# Patient Record
Sex: Female | Born: 1965 | Race: White | Hispanic: No | Marital: Married | Smoking: Never smoker
Health system: Southern US, Community
[De-identification: ages and names within clinical notes are randomized; demographics above are authoritative.]

## PROBLEM LIST (undated history)

## (undated) DIAGNOSIS — E039 Hypothyroidism, unspecified: Secondary | ICD-10-CM

## (undated) DIAGNOSIS — M810 Age-related osteoporosis without current pathological fracture: Secondary | ICD-10-CM

## (undated) DIAGNOSIS — K649 Unspecified hemorrhoids: Secondary | ICD-10-CM

## (undated) DIAGNOSIS — F845 Asperger's syndrome: Secondary | ICD-10-CM

## (undated) DIAGNOSIS — E042 Nontoxic multinodular goiter: Secondary | ICD-10-CM

## (undated) DIAGNOSIS — F909 Attention-deficit hyperactivity disorder, unspecified type: Secondary | ICD-10-CM

## (undated) DIAGNOSIS — M1990 Unspecified osteoarthritis, unspecified site: Secondary | ICD-10-CM

## (undated) DIAGNOSIS — F419 Anxiety disorder, unspecified: Secondary | ICD-10-CM

## (undated) DIAGNOSIS — K5900 Constipation, unspecified: Secondary | ICD-10-CM

## (undated) DIAGNOSIS — Z8489 Family history of other specified conditions: Secondary | ICD-10-CM

## (undated) DIAGNOSIS — B009 Herpesviral infection, unspecified: Secondary | ICD-10-CM

## (undated) DIAGNOSIS — F840 Autistic disorder: Secondary | ICD-10-CM

## (undated) DIAGNOSIS — T753XXA Motion sickness, initial encounter: Secondary | ICD-10-CM

## (undated) DIAGNOSIS — G43909 Migraine, unspecified, not intractable, without status migrainosus: Secondary | ICD-10-CM

## (undated) DIAGNOSIS — I7300 Raynaud's syndrome without gangrene: Secondary | ICD-10-CM

## (undated) HISTORY — PX: SHOULDER SURGERY: SHX246

## (undated) HISTORY — PX: TONSILLECTOMY: SUR1361

## (undated) HISTORY — PX: ARTHOSCOPIC ROTAOR CUFF REPAIR: SHX5002

## (undated) HISTORY — PX: CHOLECYSTECTOMY: SHX55

---

## 2005-06-06 ENCOUNTER — Ambulatory Visit: Payer: Self-pay | Admitting: Internal Medicine

## 2005-06-19 ENCOUNTER — Ambulatory Visit: Payer: Self-pay | Admitting: Internal Medicine

## 2005-06-25 ENCOUNTER — Ambulatory Visit: Payer: Self-pay | Admitting: Internal Medicine

## 2005-08-01 ENCOUNTER — Ambulatory Visit: Payer: Self-pay | Admitting: Internal Medicine

## 2005-09-18 ENCOUNTER — Ambulatory Visit: Payer: Self-pay | Admitting: General Surgery

## 2005-12-26 ENCOUNTER — Ambulatory Visit: Payer: Self-pay | Admitting: Internal Medicine

## 2006-07-08 ENCOUNTER — Ambulatory Visit: Payer: Self-pay | Admitting: Internal Medicine

## 2007-05-27 ENCOUNTER — Ambulatory Visit: Payer: Self-pay | Admitting: Internal Medicine

## 2007-07-02 ENCOUNTER — Ambulatory Visit: Payer: Self-pay | Admitting: Gastroenterology

## 2007-07-06 ENCOUNTER — Ambulatory Visit: Payer: Self-pay | Admitting: Internal Medicine

## 2007-07-09 ENCOUNTER — Ambulatory Visit: Payer: Self-pay | Admitting: General Surgery

## 2007-07-15 ENCOUNTER — Ambulatory Visit: Payer: Self-pay | Admitting: General Surgery

## 2007-10-12 ENCOUNTER — Ambulatory Visit: Payer: Self-pay | Admitting: Internal Medicine

## 2009-03-30 IMAGING — NM NUCLEAR MEDICINE HEPATOHBILIARY INCLUDE GB
3 series · 21 of 21 positions shown · non-contrast
Comparison: none

REASON FOR EXAM: Abdominal pain, normal U/S May 2007
COMMENTS:

[Series 1000: gallbladder statics · 4.80mm/px · 9 of 9 slices shown]
[im 1/9]
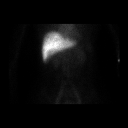
[im 2/9]
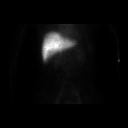
[im 3/9]
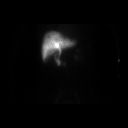
[im 4/9]
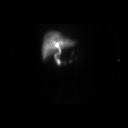
[im 5/9]
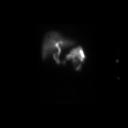
[im 6/9]
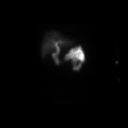
[im 7/9]
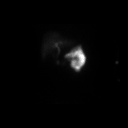
[im 8/9]
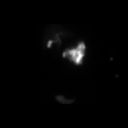
[im 9/9]
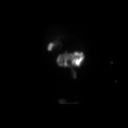

[Series 1000: gallbladder dynamic (results) · 4.80mm/px · 6 of 60 frames shown]
[frame 6/60]
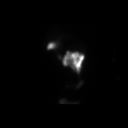
[frame 16/60]
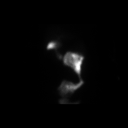
[frame 26/60]
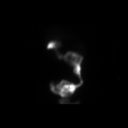
[frame 36/60]
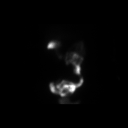
[frame 46/60]
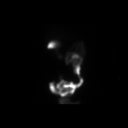
[frame 56/60]
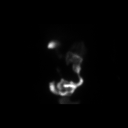

[Series 1000: gallbladder dynamic · 4.80mm/px · 6 of 60 frames shown]
[frame 6/60]
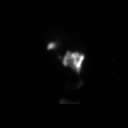
[frame 16/60]
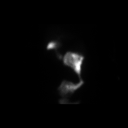
[frame 26/60]
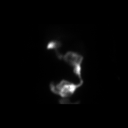
[frame 36/60]
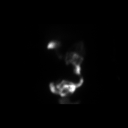
[frame 46/60]
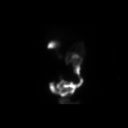
[frame 56/60]
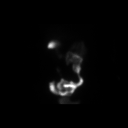

[21 of 21 positions shown; findings below may reference images not displayed]

PROCEDURE:     NM  - NM HEPATO WITH GB EJECT FRACTION  - July 06, 2007 [DATE]

RESULT:     The patient received 7.3 mCi of Technetium 99m labeled Choletec.
Standard imaging was obtained of the abdomen.

Diffuse homogeneous radiotracer activity is demonstrated within the liver.
Subsequent excretion of contrast is identified within the intrahepatic and
extrahepatic ductal system. Small bowel activity is identified at 10 minutes
and gallbladder activity is identified at 60 minutes with increased
activity. There is peristalsis and increased activity within the small
bowel.

Gallbladder ejection fraction was calculated status post intravenous
administration of 1.22 micrograms of Sincalide over 30 minutes IV injection.
The gallbladder ejection fraction was measured at 3%.
IMPRESSION: 1.     Unremarkable hepatobiliary portion of the study.
2.     Abnormal gallbladder ejection fraction consistent with gallbladder
dyskinesia. This raises the concern of chronic cholecystitis.

## 2009-10-17 ENCOUNTER — Ambulatory Visit: Payer: Self-pay | Admitting: Internal Medicine

## 2010-07-26 ENCOUNTER — Ambulatory Visit: Payer: Self-pay

## 2010-08-28 ENCOUNTER — Ambulatory Visit: Payer: Self-pay | Admitting: Unknown Physician Specialty

## 2010-12-25 ENCOUNTER — Ambulatory Visit: Payer: Self-pay | Admitting: Internal Medicine

## 2010-12-27 ENCOUNTER — Ambulatory Visit: Payer: Self-pay | Admitting: Internal Medicine

## 2012-01-02 ENCOUNTER — Ambulatory Visit: Payer: Self-pay | Admitting: Internal Medicine

## 2013-02-23 ENCOUNTER — Ambulatory Visit: Payer: Self-pay | Admitting: Internal Medicine

## 2015-03-28 ENCOUNTER — Other Ambulatory Visit: Payer: Self-pay | Admitting: Obstetrics and Gynecology

## 2015-03-28 DIAGNOSIS — Z1231 Encounter for screening mammogram for malignant neoplasm of breast: Secondary | ICD-10-CM

## 2015-04-26 ENCOUNTER — Ambulatory Visit
Admission: RE | Admit: 2015-04-26 | Discharge: 2015-04-26 | Disposition: A | Discharge status: Home / Self Care | Payer: 59 | Source: Ambulatory Visit | Attending: Obstetrics and Gynecology | Admitting: Obstetrics and Gynecology

## 2015-04-26 DIAGNOSIS — Z1231 Encounter for screening mammogram for malignant neoplasm of breast: Secondary | ICD-10-CM | POA: Diagnosis present

## 2015-05-15 ENCOUNTER — Ambulatory Visit: Admission: RE | Admit: 2015-05-15 | Payer: 59 | Source: Ambulatory Visit | Admitting: Gastroenterology

## 2015-05-15 ENCOUNTER — Encounter: Admission: RE | Payer: Self-pay | Source: Ambulatory Visit

## 2015-05-15 SURGERY — COLONOSCOPY WITH PROPOFOL
Anesthesia: General

## 2015-06-29 ENCOUNTER — Encounter: Payer: Self-pay | Admitting: *Deleted

## 2015-06-30 ENCOUNTER — Ambulatory Visit
Admission: RE | Admit: 2015-06-30 | Discharge: 2015-06-30 | Disposition: A | Discharge status: Home / Self Care | Payer: 59 | Source: Ambulatory Visit | Attending: Gastroenterology | Admitting: Gastroenterology

## 2015-06-30 ENCOUNTER — Encounter
Admission: RE | Disposition: A | Discharge status: Home / Self Care | Payer: Self-pay | Source: Ambulatory Visit | Attending: Gastroenterology

## 2015-06-30 ENCOUNTER — Encounter: Payer: Self-pay | Admitting: *Deleted

## 2015-06-30 ENCOUNTER — Ambulatory Visit: Payer: 59 | Admitting: Anesthesiology

## 2015-06-30 DIAGNOSIS — K644 Residual hemorrhoidal skin tags: Secondary | ICD-10-CM | POA: Insufficient documentation

## 2015-06-30 DIAGNOSIS — F909 Attention-deficit hyperactivity disorder, unspecified type: Secondary | ICD-10-CM | POA: Insufficient documentation

## 2015-06-30 DIAGNOSIS — F845 Asperger's syndrome: Secondary | ICD-10-CM | POA: Diagnosis not present

## 2015-06-30 DIAGNOSIS — B009 Herpesviral infection, unspecified: Secondary | ICD-10-CM | POA: Diagnosis not present

## 2015-06-30 DIAGNOSIS — E039 Hypothyroidism, unspecified: Secondary | ICD-10-CM | POA: Insufficient documentation

## 2015-06-30 DIAGNOSIS — K5730 Diverticulosis of large intestine without perforation or abscess without bleeding: Secondary | ICD-10-CM | POA: Diagnosis not present

## 2015-06-30 DIAGNOSIS — Z1211 Encounter for screening for malignant neoplasm of colon: Secondary | ICD-10-CM | POA: Diagnosis not present

## 2015-06-30 DIAGNOSIS — Z79899 Other long term (current) drug therapy: Secondary | ICD-10-CM | POA: Diagnosis not present

## 2015-06-30 DIAGNOSIS — Z7951 Long term (current) use of inhaled steroids: Secondary | ICD-10-CM | POA: Insufficient documentation

## 2015-06-30 DIAGNOSIS — F419 Anxiety disorder, unspecified: Secondary | ICD-10-CM | POA: Diagnosis not present

## 2015-06-30 DIAGNOSIS — Z9889 Other specified postprocedural states: Secondary | ICD-10-CM | POA: Diagnosis not present

## 2015-06-30 DIAGNOSIS — M810 Age-related osteoporosis without current pathological fracture: Secondary | ICD-10-CM | POA: Insufficient documentation

## 2015-06-30 DIAGNOSIS — R1310 Dysphagia, unspecified: Secondary | ICD-10-CM | POA: Insufficient documentation

## 2015-06-30 HISTORY — PX: ESOPHAGOGASTRODUODENOSCOPY (EGD) WITH PROPOFOL: SHX5813

## 2015-06-30 HISTORY — DX: Hypothyroidism, unspecified: E03.9

## 2015-06-30 HISTORY — DX: Anxiety disorder, unspecified: F41.9

## 2015-06-30 HISTORY — DX: Age-related osteoporosis without current pathological fracture: M81.0

## 2015-06-30 HISTORY — DX: Herpesviral infection, unspecified: B00.9

## 2015-06-30 HISTORY — DX: Unspecified hemorrhoids: K64.9

## 2015-06-30 HISTORY — DX: Attention-deficit hyperactivity disorder, unspecified type: F90.9

## 2015-06-30 HISTORY — PX: COLONOSCOPY WITH PROPOFOL: SHX5780

## 2015-06-30 HISTORY — DX: Constipation, unspecified: K59.00

## 2015-06-30 LAB — POCT PREGNANCY, URINE: Preg Test, Ur: NEGATIVE

## 2015-06-30 SURGERY — COLONOSCOPY WITH PROPOFOL
Anesthesia: General

## 2015-06-30 MED ORDER — SODIUM CHLORIDE 0.9 % IV SOLN
INTRAVENOUS | Status: DC
  Administered 2015-06-30: 09:00:00 via INTRAVENOUS

## 2015-06-30 MED ORDER — PROPOFOL 10 MG/ML IV BOLUS
INTRAVENOUS | Status: DC | PRN
  Administered 2015-06-30: 30 mg via INTRAVENOUS
  Administered 2015-06-30: 40 mg via INTRAVENOUS
  Administered 2015-06-30 (×2): 30 mg via INTRAVENOUS
  Administered 2015-06-30: 70 mg via INTRAVENOUS
  Administered 2015-06-30: 30 mg via INTRAVENOUS

## 2015-06-30 MED ORDER — SODIUM CHLORIDE 0.9 % IV SOLN
INTRAVENOUS | Status: DC
  Administered 2015-06-30: 08:00:00 via INTRAVENOUS

## 2015-06-30 MED ORDER — GLYCOPYRROLATE 0.2 MG/ML IJ SOLN
INTRAMUSCULAR | Status: DC | PRN
  Administered 2015-06-30: .2 mg via INTRAVENOUS

## 2015-06-30 MED ORDER — LIDOCAINE HCL (CARDIAC) 20 MG/ML IV SOLN
INTRAVENOUS | Status: DC | PRN
  Administered 2015-06-30: 100 mg via INTRAVENOUS

## 2015-06-30 MED ORDER — PROPOFOL 500 MG/50ML IV EMUL
INTRAVENOUS | Status: DC | PRN
  Administered 2015-06-30: 150 ug/kg/min via INTRAVENOUS

## 2015-06-30 NOTE — Op Note (Signed)
[facility] Gastroenterology Patient Name: April Black Procedure Date: 06/30/2015 8:08 AM MRN: [id number] Account #: [id number] Date of Birth: [birth year] Admit Type: Outpatient Age: 50 Room: [facility] ENDO ROOM 4 Gender: Female Note Status: Finalized Procedure:         Colonoscopy Indications:       Screening for colorectal malignant neoplasm Providers:         Ezzard Standing. Bluford Kaufmann, MD Referring MD:      Marya Amsler. Dareen Piano, MD (Referring MD) Medicines:         Monitored Anesthesia Care Complications:     No immediate complications. Procedure:         Pre-Anesthesia Assessment:                    - Prior to the procedure, a History and Physical was                     performed, and patient medications, allergies and                     sensitivities were reviewed. The patient's tolerance of                     previous anesthesia was reviewed.                    - The risks and benefits of the procedure and the sedation                     options and risks were discussed with the patient. All                     questions were answered and informed consent was obtained.                    - After reviewing the risks and benefits, the patient was                     deemed in satisfactory condition to undergo the procedure.                    After obtaining informed consent, the colonoscope was                     passed under direct vision. Throughout the procedure, the                     patient's blood pressure, pulse, and oxygen saturations                     were monitored continuously. The Colonoscope was                     introduced through the anus and advanced to the the cecum,                     identified by appendiceal orifice and ileocecal valve. The                     colonoscopy was performed without difficulty. The patient                     tolerated the procedure well. The quality of the bowel  preparation was fair. Findings:      The perianal exam findings include non-thrombosed external hemorrhoids.      Multiple small-mouthed diverticula were found in the sigmoid colon.      The exam was otherwise without abnormality. Impression:        - Non-thrombosed external hemorrhoids found on perianal                     exam.                    - Diverticulosis in the sigmoid colon.                    - The examination was otherwise normal.                    - No specimens collected. Recommendation:    - Discharge patient to home.                    - Repeat colonoscopy in 10 years for surveillance.                    - The findings and recommendations were discussed with the                     patient. Procedure Code(s): --- Professional ---                    [phone], Colonoscopy, flexible; diagnostic, including                     collection of specimen(s) by brushing or washing, when                     performed (separate procedure) Diagnosis Code(s): --- Professional ---                    Z12.11, Encounter for screening for malignant neoplasm of                     colon                    K64.4, Residual hemorrhoidal skin tags                    K57.30, Diverticulosis of large intestine without                     perforation or abscess without bleeding CPT copyright 2014 American Medical Association. All rights reserved. The codes documented in this report are preliminary and upon coder review may  be revised to meet current compliance requirements. Wallace Cullens, MD 06/30/2015 9:03:07 AM This report has been signed electronically. Number of Addenda: 0 Note Initiated On: 06/30/2015 8:08 AM Scope Withdrawal Time: 0 hours 7 minutes 52 seconds  Total Procedure Duration: 0 hours 11 minutes 51 seconds       [facility]

## 2015-06-30 NOTE — Transfer of Care (Signed)
Immediate Anesthesia Transfer of Care Note  Patient: April Black  Procedure(s) Performed: Procedure(s): COLONOSCOPY WITH PROPOFOL (N/A) ESOPHAGOGASTRODUODENOSCOPY (EGD) WITH PROPOFOL (N/A)  Patient Location: Endoscopy Unit  Anesthesia Type:General  Level of Consciousness: sedated  Airway & Oxygen Therapy: Patient Spontanous Breathing and Patient connected to nasal cannula oxygen  Post-op Assessment: Report given to RN and Post -op Vital signs reviewed and stable  Post vital signs: Reviewed and stable  Last Vitals:  Filed Vitals:   06/30/15 0745  BP: 128/78  Pulse: 72  Temp: 36.3 C  Resp: 14    Complications: No apparent anesthesia complications

## 2015-06-30 NOTE — Anesthesia Preprocedure Evaluation (Signed)
Anesthesia Evaluation  Patient identified by MRN, date of birth, ID band Patient awake    Reviewed: Allergy & Precautions, H&P , NPO status , Patient's Chart, lab work & pertinent test results  Airway Mallampati: II  TM Distance: >3 FB Neck ROM: full    Dental  (+) Poor Dentition   Pulmonary neg pulmonary ROS, neg shortness of breath,    Pulmonary exam normal breath sounds clear to auscultation       Cardiovascular Exercise Tolerance: Good (-) angina(-) Past MI and (-) DOE negative cardio ROS Normal cardiovascular exam Rhythm:regular Rate:Normal     Neuro/Psych PSYCHIATRIC DISORDERS Anxiety negative neurological ROS     GI/Hepatic negative GI ROS, Neg liver ROS,   Endo/Other  Hypothyroidism   Renal/GU negative Renal ROS  negative genitourinary   Musculoskeletal   Abdominal   Peds  Hematology negative hematology ROS (+)   Anesthesia Other Findings Past Medical History:   ADHD (attention deficit hyperactivity disorder)              Anxiety                                                      Asperger syndrome                                            Constipation                                                 Hemorrhoids                                                  Herpes                                                       Hypothyroidism                                               Osteoporosis                                                Past Surgical History:   TONSILLECTOMY                                                 ARTHOSCOPIC ROTAOR CUFF REPAIR  BMI    Body Mass Index   25.83 kg/m 2      Reproductive/Obstetrics negative OB ROS                             Anesthesia Physical Anesthesia Plan  ASA: III  Anesthesia Plan: General   Post-op Pain Management:    Induction:   Airway Management Planned:   Additional Equipment:    Intra-op Plan:   Post-operative Plan:   Informed Consent: I have reviewed the patients History and Physical, chart, labs and discussed the procedure including the risks, benefits and alternatives for the proposed anesthesia with the patient or authorized representative who has indicated his/her understanding and acceptance.   Dental Advisory Given  Plan Discussed with: Anesthesiologist, CRNA and Surgeon  Anesthesia Plan Comments:         Anesthesia Quick Evaluation

## 2015-06-30 NOTE — Op Note (Signed)
[facility] Gastroenterology Patient Name: April Black Procedure Date: 06/30/2015 8:19 AM MRN: [id number] Account #: [id number] Date of Birth: 1966/03/06 Admit Type: Outpatient Age: 50 Room: [facility] ENDO ROOM 4 Gender: Female Note Status: Finalized Procedure:         Upper GI endoscopy Indications:       Dysphagia Providers:         Ezzard Standing. Bluford Kaufmann, MD Referring MD:      Marya Amsler. Dareen Piano, MD (Referring MD) Medicines:         Monitored Anesthesia Care Complications:     No immediate complications. Procedure:         Pre-Anesthesia Assessment:                    - Prior to the procedure, a History and Physical was                     performed, and patient medications, allergies and                     sensitivities were reviewed. The patient's tolerance of                     previous anesthesia was reviewed.                    - The risks and benefits of the procedure and the sedation                     options and risks were discussed with the patient. All                     questions were answered and informed consent was obtained.                    - After reviewing the risks and benefits, the patient was                     deemed in satisfactory condition to undergo the procedure.                    After obtaining informed consent, the endoscope was passed                     under direct vision. Throughout the procedure, the                     patient's blood pressure, pulse, and oxygen saturations                     were monitored continuously. The Endoscope was introduced                     through the mouth, and advanced to the second part of                     duodenum. The upper GI endoscopy was accomplished without                     difficulty. The patient tolerated the procedure well. Findings:      A single medium-sized nodule with a localized distribution was found in       the lower third of the esophagus. Due to possibility of this  being a  varix, elected not to biopsy. The scope was withdrawn. Dilation was       performed with a Maloney dilator with moderate resistance at 54 Fr.      The entire examined stomach was normal.      The examined duodenum was normal.      The exam of the esophagus was otherwise normal. Impression:        - Nodule found in the esophagus. Dilated.                    - Normal stomach.                    - Normal examined duodenum.                    - No specimens collected. Recommendation:    - Discharge patient to home.                    - Observe patient's clinical course.                    - The findings and recommendations were discussed with the                     patient.                    - Consider EUS to evaluate this lesion in esophagus. Procedure Code(s): --- Professional ---                    [phone], Esophagogastroduodenoscopy, flexible, transoral;                     diagnostic, including collection of specimen(s) by                     brushing or washing, when performed (separate procedure)                    43450, Dilation of esophagus, by unguided sound or bougie,                     single or multiple passes Diagnosis Code(s): --- Professional ---                    K22.8, Other specified diseases of esophagus                    R13.10, Dysphagia, unspecified CPT copyright 2014 American Medical Association. All rights reserved. The codes documented in this report are preliminary and upon coder review may  be revised to meet current compliance requirements. Wallace Cullens, MD 06/30/2015 8:46:29 AM This report has been signed electronically. Number of Addenda: 0 Note Initiated On: 06/30/2015 8:19 AM      [facility]

## 2015-06-30 NOTE — H&P (Signed)
    Primary Care Physician:  Lauro Regulus., MD Primary Gastroenterologist:  Dr. Bluford Kaufmann  Pre-Procedure History & Physical: HPI:  April Black is a 50 y.o. female is here for an EGD/colonoscopy   Past Medical History  Diagnosis Date  . ADHD (attention deficit hyperactivity disorder)   . Anxiety   . Asperger syndrome   . Constipation   . Hemorrhoids   . Herpes   . Hypothyroidism   . Osteoporosis     Past Surgical History  Procedure Laterality Date  . Tonsillectomy    . Arthoscopic rotaor cuff repair      Prior to Admission medications   Medication Sig Start Date End Date Taking? Authorizing Provider  alendronate (FOSAMAX) 70 MG tablet Take 70 mg by mouth once a week. Take with a full glass of water on an empty stomach.   Yes Historical Provider, MD  amphetamine-dextroamphetamine (ADDERALL) 20 MG tablet Take 20 mg by mouth daily.   Yes Historical Provider, MD  Azelastine-Fluticasone 137-50 MCG/ACT SUSP Place into the nose.   Yes Historical Provider, MD  Cholecalciferol 2000 units CAPS Take by mouth.   Yes Historical Provider, MD  ferrous sulfate 325 (65 FE) MG tablet Take 325 mg by mouth daily with breakfast.   Yes Historical Provider, MD  fluconazole (DIFLUCAN) 150 MG tablet Take 150 mg by mouth daily.   Yes Historical Provider, MD  sertraline (ZOLOFT) 50 MG tablet Take 50 mg by mouth daily.   Yes Historical Provider, MD  solifenacin (VESICARE) 5 MG tablet Take 5 mg by mouth daily.   Yes Historical Provider, MD  valACYclovir (VALTREX) 1000 MG tablet Take 1,000 mg by mouth 2 (two) times daily.   Yes Historical Provider, MD    Allergies as of 05/12/2015  . (Not on File)    Family History  Problem Relation Age of Onset  . Breast cancer Neg Hx     Social History   Social History  . Marital Status: Married    Spouse Name: N/A  . Number of Children: N/A  . Years of Education: N/A   Occupational History  . Not on file.   Social History Main Topics  . Smoking  status: Never Smoker   . Smokeless tobacco: Never Used  . Alcohol Use: No  . Drug Use: No  . Sexual Activity: Not on file   Other Topics Concern  . Not on file   Social History Narrative    Review of Systems: See HPI, otherwise negative ROS  Physical Exam: BP 128/78 mmHg  Pulse 72  Temp(Src) 97.3 F (36.3 C) (Tympanic)  Resp 14  Ht  (1.676 m)  Wt 72.576 kg (160 lb)  BMI 25.84 kg/m2  SpO2 100% General:   Alert,  pleasant and cooperative in NAD Head:  Normocephalic and atraumatic. Neck:  Supple; no masses or thyromegaly. Lungs:  Clear throughout to auscultation.    Heart:  Regular rate and rhythm. Abdomen:  Soft, nontender and nondistended. Normal bowel sounds, without guarding, and without rebound.   Neurologic:  Alert and  oriented x4;  grossly normal neurologically.  Impression/Plan: April Black is here for an EGD/colonoscopy to be performed for screening and dysphagia Risks, benefits, limitations, and alternatives regarding  EGD/colonoscopy have been reviewed with the patient.  Questions have been answered.  All parties agreeable.   April Black, Ezzard Standing, MD  06/30/2015, 7:54 AM

## 2015-06-30 NOTE — Anesthesia Postprocedure Evaluation (Signed)
Anesthesia Post Note  Patient: April Black  Procedure(s) Performed: Procedure(s) (LRB): COLONOSCOPY WITH PROPOFOL (N/A) ESOPHAGOGASTRODUODENOSCOPY (EGD) WITH PROPOFOL (N/A)  Patient location during evaluation: Endoscopy Anesthesia Type: General Level of consciousness: awake and alert Pain management: pain level controlled Vital Signs Assessment: post-procedure vital signs reviewed and stable Respiratory status: spontaneous breathing, nonlabored ventilation, respiratory function stable and patient connected to nasal cannula oxygen Cardiovascular status: blood pressure returned to baseline and stable Postop Assessment: no signs of nausea or vomiting Anesthetic complications: no    Last Vitals:  Filed Vitals:   06/30/15 0920 06/30/15 0930  BP: 122/79 119/83  Pulse: 68 73  Temp:    Resp: 14 15    Last Pain: There were no vitals filed for this visit.               Cleda Mccreedy Piscitello

## 2015-07-05 ENCOUNTER — Encounter: Payer: Self-pay | Admitting: Gastroenterology

## 2015-07-18 ENCOUNTER — Telehealth: Payer: Self-pay

## 2015-07-18 NOTE — Telephone Encounter (Signed)
  Oncology Nurse Navigator Documentation  Navigator Location: [facility] (07/18/15 0900) Navigator Encounter Type: Telephone (07/18/15 0900)               Barriers/Navigation Needs: Coordination of Care (07/18/15 0900)   Interventions: Coordination of Care (07/18/15 0900)   Coordination of Care: EUS (07/18/15 0900)                  Time Spent with Patient: 30 (07/18/15 0900)   Received referral for EUS. It has been scheduled for 07/27/15 with Dr Shana Chute at [facility] for evaluation of esophageal lesion. Went over instructions and copy also mailed to home address after verifying. Provided my contact information for any future questions or concerns.  INSTRUCTIONS FOR ENDOSCOPIC ULTRASOUND -Your procedure has been scheduled for February 16th with Dr Shana Chute at [facility] -The hospital will contact you to pre-register over the phone. If for any reason you have not received a call within one week prior to your scheduled procedure date, please call [phone]. -To get your scheduled arrival time, please call the Endoscopy unit at  [phone] between 1-3pm on: February 15th   -ON THE DAY OF YOU PROCEDURE:  1. If you are scheduled for a morning procedure, nothing to drink after midnight  -If you are scheduled for an afternoon procedure, you may have clear liquids until 5 hours prior  to the procedure but no carbonated drinks or broth  2. NO FOOD THE DAY OF YOUR PROCEDURE  3. You may take your heart, seizure, blood pressure, Parkinson's or breathing medications at  6am with just enough water to get your pills down  4. Do not take any oral Diabetic medications the morning of your procedure.  5. If you are a diabetic and are using insulin, please notify your prescribing physician of this  procedure as your dose may need to be altered related to not being able to eat or drink.   5. Do not take Vitamins   -On the day of your procedure, come to the [facility] Admitting/Registration  desk (First desk on the right) at the scheduled arrival time. You MUST have someone drive you home from your procedure. You must have a responsible adult with a valid drivers license who is on site throughout your entire procedure and who can stay with you for several hours after your procedure. You may not go home alone in a taxi, shuttle [place] or bus, as the drivers will not be responsible for you.  --If you have any questions please call me at the above contact

## 2015-07-27 ENCOUNTER — Ambulatory Visit: Payer: 59 | Admitting: Certified Registered Nurse Anesthetist

## 2015-07-27 ENCOUNTER — Encounter
Admission: RE | Disposition: A | Discharge status: Home / Self Care | Payer: Self-pay | Source: Ambulatory Visit | Attending: Internal Medicine

## 2015-07-27 ENCOUNTER — Ambulatory Visit
Admission: RE | Admit: 2015-07-27 | Discharge: 2015-07-27 | Disposition: A | Discharge status: Home / Self Care | Payer: 59 | Source: Ambulatory Visit | Attending: Internal Medicine | Admitting: Internal Medicine

## 2015-07-27 ENCOUNTER — Encounter: Payer: Self-pay | Admitting: *Deleted

## 2015-07-27 DIAGNOSIS — Z1211 Encounter for screening for malignant neoplasm of colon: Secondary | ICD-10-CM | POA: Insufficient documentation

## 2015-07-27 DIAGNOSIS — F909 Attention-deficit hyperactivity disorder, unspecified type: Secondary | ICD-10-CM | POA: Diagnosis not present

## 2015-07-27 DIAGNOSIS — R1310 Dysphagia, unspecified: Secondary | ICD-10-CM | POA: Diagnosis not present

## 2015-07-27 DIAGNOSIS — E039 Hypothyroidism, unspecified: Secondary | ICD-10-CM | POA: Insufficient documentation

## 2015-07-27 DIAGNOSIS — M810 Age-related osteoporosis without current pathological fracture: Secondary | ICD-10-CM | POA: Diagnosis not present

## 2015-07-27 DIAGNOSIS — F845 Asperger's syndrome: Secondary | ICD-10-CM | POA: Diagnosis not present

## 2015-07-27 DIAGNOSIS — Z79899 Other long term (current) drug therapy: Secondary | ICD-10-CM | POA: Insufficient documentation

## 2015-07-27 DIAGNOSIS — Z539 Procedure and treatment not carried out, unspecified reason: Secondary | ICD-10-CM | POA: Diagnosis not present

## 2015-07-27 LAB — POCT PREGNANCY, URINE: Preg Test, Ur: NEGATIVE

## 2015-07-27 SURGERY — UPPER ESOPHAGEAL ENDOSCOPIC ULTRASOUND (EUS)
Anesthesia: General

## 2015-07-27 MED ORDER — SODIUM CHLORIDE 0.9 % IV SOLN
INTRAVENOUS | Status: DC
  Administered 2015-07-27: 12:00:00 via INTRAVENOUS

## 2015-07-27 NOTE — Anesthesia Preprocedure Evaluation (Addendum)
Anesthesia Evaluation  Patient identified by MRN, date of birth, ID band Patient awake    Reviewed: Allergy & Precautions, NPO status , Patient's Chart, lab work & pertinent test results, reviewed documented beta blocker date and time   Airway Mallampati: II  TM Distance: >3 FB     Dental  (+) Chipped   Pulmonary           Cardiovascular      Neuro/Psych PSYCHIATRIC DISORDERS Anxiety    GI/Hepatic   Endo/Other  Hypothyroidism   Renal/GU      Musculoskeletal   Abdominal   Peds  Hematology   Anesthesia Other Findings ADHD. Asperger.  Reproductive/Obstetrics                            Anesthesia Physical Anesthesia Plan  ASA: II  Anesthesia Plan: General   Post-op Pain Management:    Induction: Intravenous  Airway Management Planned: Nasal Cannula  Additional Equipment:   Intra-op Plan:   Post-operative Plan:   Informed Consent: I have reviewed the patients History and Physical, chart, labs and discussed the procedure including the risks, benefits and alternatives for the proposed anesthesia with the patient or authorized representative who has indicated his/her understanding and acceptance.     Plan Discussed with: CRNA  Anesthesia Plan Comments:         Anesthesia Quick Evaluation

## 2015-07-27 NOTE — H&P (View-Only) (Signed)
    Primary Care Physician:  Lauro Regulus., MD Primary Gastroenterologist:  Dr. Bluford Kaufmann  Pre-Procedure History & Physical: HPI:  April Black is a 50 y.o. female is here for an EGD/colonoscopy   Past Medical History  Diagnosis Date  . ADHD (attention deficit hyperactivity disorder)   . Anxiety   . Asperger syndrome   . Constipation   . Hemorrhoids   . Herpes   . Hypothyroidism   . Osteoporosis     Past Surgical History  Procedure Laterality Date  . Tonsillectomy    . Arthoscopic rotaor cuff repair      Prior to Admission medications   Medication Sig Start Date End Date Taking? Authorizing Provider  alendronate (FOSAMAX) 70 MG tablet Take 70 mg by mouth once a week. Take with a full glass of water on an empty stomach.   Yes Historical Provider, MD  amphetamine-dextroamphetamine (ADDERALL) 20 MG tablet Take 20 mg by mouth daily.   Yes Historical Provider, MD  Azelastine-Fluticasone 137-50 MCG/ACT SUSP Place into the nose.   Yes Historical Provider, MD  Cholecalciferol 2000 units CAPS Take by mouth.   Yes Historical Provider, MD  ferrous sulfate 325 (65 FE) MG tablet Take 325 mg by mouth daily with breakfast.   Yes Historical Provider, MD  fluconazole (DIFLUCAN) 150 MG tablet Take 150 mg by mouth daily.   Yes Historical Provider, MD  sertraline (ZOLOFT) 50 MG tablet Take 50 mg by mouth daily.   Yes Historical Provider, MD  solifenacin (VESICARE) 5 MG tablet Take 5 mg by mouth daily.   Yes Historical Provider, MD  valACYclovir (VALTREX) 1000 MG tablet Take 1,000 mg by mouth 2 (two) times daily.   Yes Historical Provider, MD    Allergies as of 05/12/2015  . (Not on File)    Family History  Problem Relation Age of Onset  . Breast cancer Neg Hx     Social History   Social History  . Marital Status: Married    Spouse Name: N/A  . Number of Children: N/A  . Years of Education: N/A   Occupational History  . Not on file.   Social History Main Topics  . Smoking  status: Never Smoker   . Smokeless tobacco: Never Used  . Alcohol Use: No  . Drug Use: No  . Sexual Activity: Not on file   Other Topics Concern  . Not on file   Social History Narrative    Review of Systems: See HPI, otherwise negative ROS  Physical Exam: BP 128/78 mmHg  Pulse 72  Temp(Src) 97.3 F (36.3 C) (Tympanic)  Resp 14  Ht  (1.676 m)  Wt 72.576 kg (160 lb)  BMI 25.84 kg/m2  SpO2 100% General:   Alert,  pleasant and cooperative in NAD Head:  Normocephalic and atraumatic. Neck:  Supple; no masses or thyromegaly. Lungs:  Clear throughout to auscultation.    Heart:  Regular rate and rhythm. Abdomen:  Soft, nontender and nondistended. Normal bowel sounds, without guarding, and without rebound.   Neurologic:  Alert and  oriented x4;  grossly normal neurologically.  Impression/Plan: April Black is here for an EGD/colonoscopy to be performed for screening and dysphagia Risks, benefits, limitations, and alternatives regarding  EGD/colonoscopy have been reviewed with the patient.  Questions have been answered.  All parties agreeable.   Jannet Calip, Ezzard Standing, MD  06/30/2015, 7:54 AM

## 2015-07-27 NOTE — Interval H&P Note (Signed)
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 After consideration of risks, benefits and other options for treatment, the patient has consented to  Procedure(s): UPPER ESOPHAGEAL ENDOSCOPIC ULTRASOUND (EUS) (N/A) as a surgical intervention .  The patient's history has been reviewed, patient examined, no change in status, stable for surgery.  I have reviewed the patient's chart and labs.  Questions were answered to the patient's satisfaction.     Mike Gip

## 2015-07-31 ENCOUNTER — Telehealth: Payer: Self-pay

## 2015-07-31 NOTE — Telephone Encounter (Signed)
  Oncology Nurse Navigator Documentation  Navigator Location: [facility] (07/31/15 1100) Navigator Encounter Type: Telephone (07/31/15 1100)                                          Time Spent with Patient: 15 (07/31/15 1100)   Luria at [facility] notified of radial scope malfunction and that EUS is being rearranged at [facility] for early this week

## 2015-11-17 ENCOUNTER — Other Ambulatory Visit: Payer: Self-pay | Admitting: Surgery

## 2015-11-17 DIAGNOSIS — M778 Other enthesopathies, not elsewhere classified: Secondary | ICD-10-CM

## 2015-11-17 DIAGNOSIS — M7582 Other shoulder lesions, left shoulder: Principal | ICD-10-CM

## 2015-12-25 ENCOUNTER — Ambulatory Visit
Admission: RE | Admit: 2015-12-25 | Discharge: 2015-12-25 | Disposition: A | Discharge status: Home / Self Care | Payer: 59 | Source: Ambulatory Visit | Attending: Surgery | Admitting: Surgery

## 2015-12-25 DIAGNOSIS — M778 Other enthesopathies, not elsewhere classified: Secondary | ICD-10-CM

## 2015-12-25 DIAGNOSIS — M7582 Other shoulder lesions, left shoulder: Secondary | ICD-10-CM | POA: Diagnosis present

## 2016-01-11 ENCOUNTER — Encounter: Payer: Self-pay | Admitting: *Deleted

## 2016-01-17 ENCOUNTER — Encounter
Admission: RE | Disposition: A | Discharge status: Home / Self Care | Payer: Self-pay | Source: Ambulatory Visit | Attending: Surgery

## 2016-01-17 ENCOUNTER — Ambulatory Visit: Payer: 59 | Admitting: Student in an Organized Health Care Education/Training Program

## 2016-01-17 ENCOUNTER — Ambulatory Visit
Admission: RE | Admit: 2016-01-17 | Discharge: 2016-01-17 | Disposition: A | Discharge status: Home / Self Care | Payer: 59 | Source: Ambulatory Visit | Attending: Surgery | Admitting: Surgery

## 2016-01-17 DIAGNOSIS — F419 Anxiety disorder, unspecified: Secondary | ICD-10-CM | POA: Insufficient documentation

## 2016-01-17 DIAGNOSIS — M7542 Impingement syndrome of left shoulder: Secondary | ICD-10-CM | POA: Insufficient documentation

## 2016-01-17 DIAGNOSIS — Z79899 Other long term (current) drug therapy: Secondary | ICD-10-CM | POA: Insufficient documentation

## 2016-01-17 DIAGNOSIS — F845 Asperger's syndrome: Secondary | ICD-10-CM | POA: Diagnosis not present

## 2016-01-17 DIAGNOSIS — K219 Gastro-esophageal reflux disease without esophagitis: Secondary | ICD-10-CM | POA: Insufficient documentation

## 2016-01-17 DIAGNOSIS — K5790 Diverticulosis of intestine, part unspecified, without perforation or abscess without bleeding: Secondary | ICD-10-CM | POA: Insufficient documentation

## 2016-01-17 DIAGNOSIS — Z882 Allergy status to sulfonamides status: Secondary | ICD-10-CM | POA: Insufficient documentation

## 2016-01-17 DIAGNOSIS — M810 Age-related osteoporosis without current pathological fracture: Secondary | ICD-10-CM | POA: Diagnosis not present

## 2016-01-17 DIAGNOSIS — Z885 Allergy status to narcotic agent status: Secondary | ICD-10-CM | POA: Insufficient documentation

## 2016-01-17 DIAGNOSIS — B009 Herpesviral infection, unspecified: Secondary | ICD-10-CM | POA: Insufficient documentation

## 2016-01-17 DIAGNOSIS — Z91048 Other nonmedicinal substance allergy status: Secondary | ICD-10-CM | POA: Insufficient documentation

## 2016-01-17 DIAGNOSIS — Z7951 Long term (current) use of inhaled steroids: Secondary | ICD-10-CM | POA: Diagnosis not present

## 2016-01-17 DIAGNOSIS — Z8249 Family history of ischemic heart disease and other diseases of the circulatory system: Secondary | ICD-10-CM | POA: Diagnosis not present

## 2016-01-17 DIAGNOSIS — E049 Nontoxic goiter, unspecified: Secondary | ICD-10-CM | POA: Insufficient documentation

## 2016-01-17 DIAGNOSIS — Z9049 Acquired absence of other specified parts of digestive tract: Secondary | ICD-10-CM | POA: Insufficient documentation

## 2016-01-17 DIAGNOSIS — E039 Hypothyroidism, unspecified: Secondary | ICD-10-CM | POA: Insufficient documentation

## 2016-01-17 DIAGNOSIS — Z9889 Other specified postprocedural states: Secondary | ICD-10-CM | POA: Diagnosis not present

## 2016-01-17 DIAGNOSIS — F909 Attention-deficit hyperactivity disorder, unspecified type: Secondary | ICD-10-CM | POA: Insufficient documentation

## 2016-01-17 DIAGNOSIS — Z823 Family history of stroke: Secondary | ICD-10-CM | POA: Insufficient documentation

## 2016-01-17 DIAGNOSIS — S43432A Superior glenoid labrum lesion of left shoulder, initial encounter: Secondary | ICD-10-CM | POA: Insufficient documentation

## 2016-01-17 DIAGNOSIS — Z8379 Family history of other diseases of the digestive system: Secondary | ICD-10-CM | POA: Insufficient documentation

## 2016-01-17 DIAGNOSIS — X58XXXA Exposure to other specified factors, initial encounter: Secondary | ICD-10-CM | POA: Insufficient documentation

## 2016-01-17 HISTORY — PX: SHOULDER ARTHROSCOPY: SHX128

## 2016-01-17 HISTORY — DX: Autistic disorder: F84.0

## 2016-01-17 HISTORY — DX: Unspecified osteoarthritis, unspecified site: M19.90

## 2016-01-17 HISTORY — DX: Family history of other specified conditions: Z84.89

## 2016-01-17 HISTORY — DX: Migraine, unspecified, not intractable, without status migrainosus: G43.909

## 2016-01-17 HISTORY — DX: Nontoxic multinodular goiter: E04.2

## 2016-01-17 HISTORY — DX: Motion sickness, initial encounter: T75.3XXA

## 2016-01-17 SURGERY — ARTHROSCOPY, SHOULDER
Anesthesia: Regional | Laterality: Left | Wound class: Clean

## 2016-01-17 MED ORDER — PROPOFOL 10 MG/ML IV BOLUS
INTRAVENOUS | Status: DC | PRN
  Administered 2016-01-17: 200 mg via INTRAVENOUS

## 2016-01-17 MED ORDER — ROPIVACAINE HCL 5 MG/ML IJ SOLN
INTRAMUSCULAR | Status: DC | PRN
  Administered 2016-01-17: 200 mg via PERINEURAL

## 2016-01-17 MED ORDER — DIPHENHYDRAMINE HCL 50 MG/ML IJ SOLN
INTRAMUSCULAR | Status: DC | PRN
  Administered 2016-01-17: 12.5 mg via INTRAVENOUS

## 2016-01-17 MED ORDER — EPHEDRINE SULFATE 50 MG/ML IJ SOLN
INTRAMUSCULAR | Status: DC | PRN
  Administered 2016-01-17: 10 mg via INTRAVENOUS

## 2016-01-17 MED ORDER — FENTANYL CITRATE (PF) 100 MCG/2ML IJ SOLN
INTRAMUSCULAR | Status: DC | PRN
  Administered 2016-01-17: 100 ug via INTRAVENOUS

## 2016-01-17 MED ORDER — OXYCODONE HCL 5 MG PO TABS: 5.0000 mg | ORAL_TABLET | ORAL | 0 refills | Status: DC | PRN

## 2016-01-17 MED ORDER — ONDANSETRON HCL 4 MG/2ML IJ SOLN: 4.0000 mg | Freq: Four times a day (QID) | INTRAMUSCULAR | Status: DC | PRN

## 2016-01-17 MED ORDER — BUPIVACAINE-EPINEPHRINE (PF) 0.5% -1:200000 IJ SOLN
INTRAMUSCULAR | Status: DC | PRN
  Administered 2016-01-17: 20 mL via PERINEURAL

## 2016-01-17 MED ORDER — MIDAZOLAM HCL 2 MG/2ML IJ SOLN
INTRAMUSCULAR | Status: DC | PRN
  Administered 2016-01-17: 2 mg via INTRAVENOUS

## 2016-01-17 MED ORDER — SCOPOLAMINE 1 MG/3DAYS TD PT72
1.0000 | MEDICATED_PATCH | TRANSDERMAL | Status: DC
  Administered 2016-01-17: 1.5 mg via TRANSDERMAL

## 2016-01-17 MED ORDER — ONDANSETRON HCL 4 MG PO TABS: 4.0000 mg | ORAL_TABLET | Freq: Four times a day (QID) | ORAL | Status: DC | PRN

## 2016-01-17 MED ORDER — OXYCODONE HCL 5 MG/5ML PO SOLN: 5.0000 mg | Freq: Once | ORAL | Status: DC | PRN

## 2016-01-17 MED ORDER — MEPERIDINE HCL 25 MG/ML IJ SOLN: 6.2500 mg | INTRAMUSCULAR | Status: DC | PRN

## 2016-01-17 MED ORDER — METOCLOPRAMIDE HCL 5 MG/ML IJ SOLN: 5.0000 mg | Freq: Three times a day (TID) | INTRAMUSCULAR | Status: DC | PRN

## 2016-01-17 MED ORDER — OXYCODONE HCL 5 MG PO TABS: 5.0000 mg | ORAL_TABLET | ORAL | Status: DC | PRN

## 2016-01-17 MED ORDER — DEXAMETHASONE SODIUM PHOSPHATE 10 MG/ML IJ SOLN
INTRAMUSCULAR | Status: DC | PRN
  Administered 2016-01-17: 4 mg via INTRAVENOUS

## 2016-01-17 MED ORDER — OXYCODONE HCL 5 MG PO TABS: 5.0000 mg | ORAL_TABLET | Freq: Once | ORAL | Status: DC | PRN

## 2016-01-17 MED ORDER — POTASSIUM CHLORIDE IN NACL 20-0.9 MEQ/L-% IV SOLN: INTRAVENOUS | Status: DC

## 2016-01-17 MED ORDER — ONDANSETRON HCL 4 MG/2ML IJ SOLN
INTRAMUSCULAR | Status: DC | PRN
  Administered 2016-01-17: 4 mg via INTRAVENOUS

## 2016-01-17 MED ORDER — GLYCOPYRROLATE 0.2 MG/ML IJ SOLN
INTRAMUSCULAR | Status: DC | PRN
  Administered 2016-01-17: 0.1 mg via INTRAVENOUS

## 2016-01-17 MED ORDER — DEXAMETHASONE SODIUM PHOSPHATE 4 MG/ML IJ SOLN
INTRAMUSCULAR | Status: DC | PRN
  Administered 2016-01-17: 4 mg via PERINEURAL

## 2016-01-17 MED ORDER — LIDOCAINE HCL (CARDIAC) 20 MG/ML IV SOLN
INTRAVENOUS | Status: DC | PRN
  Administered 2016-01-17: 40 mg via INTRATRACHEAL

## 2016-01-17 MED ORDER — PROMETHAZINE HCL 25 MG/ML IJ SOLN: 6.2500 mg | INTRAMUSCULAR | Status: DC | PRN

## 2016-01-17 MED ORDER — LACTATED RINGERS IV SOLN
INTRAVENOUS | Status: DC
  Administered 2016-01-17: 13:00:00 via INTRAVENOUS

## 2016-01-17 MED ORDER — DEXTROSE 5 % IV SOLN
2000.0000 mg | Freq: Once | INTRAVENOUS | Status: AC
  Administered 2016-01-17: 2000 mg via INTRAVENOUS

## 2016-01-17 MED ORDER — HYDROMORPHONE HCL 1 MG/ML IJ SOLN: 0.2500 mg | INTRAMUSCULAR | Status: DC | PRN

## 2016-01-17 MED ORDER — ONDANSETRON 4 MG PO TBDP: 4.0000 mg | ORAL_TABLET | Freq: Three times a day (TID) | ORAL | 1 refills | Status: DC | PRN

## 2016-01-17 MED ORDER — METOCLOPRAMIDE HCL 5 MG PO TABS: 5.0000 mg | ORAL_TABLET | Freq: Three times a day (TID) | ORAL | Status: DC | PRN

## 2016-01-17 SURGICAL SUPPLY — 38 items
ANCHOR JUGGERKNOT WTAP NDL 2.9 (Anchor) ×2 IMPLANT
ANCHOR SUT W/ ORTHOCORD (Anchor) ×2 IMPLANT
BIT DRILL JUGRKNT W/NDL BIT2.9 (DRILL) ×1 IMPLANT
BLADE FULL RADIUS 3.5 (BLADE) ×2 IMPLANT
BUR ACROMIONIZER 4.0 (BURR) ×2 IMPLANT
CANNULA SHAVER 8MMX76MM (CANNULA) ×4 IMPLANT
CHLORAPREP W/TINT 26ML (MISCELLANEOUS) ×4 IMPLANT
COVER LIGHT HANDLE UNIVERSAL (MISCELLANEOUS) ×4 IMPLANT
COVER MAYO STAND STRL (DRAPES) ×2 IMPLANT
DRAPE IMP U-DRAPE 54X76 (DRAPES) ×4 IMPLANT
DRILL JUGGERKNOT W/NDL BIT 2.9 (DRILL) ×2
DRSG TEGADERM 4X4.75 (GAUZE/BANDAGES/DRESSINGS) ×6 IMPLANT
GAUZE PETRO XEROFOAM 1X8 (MISCELLANEOUS) ×2 IMPLANT
GAUZE SPONGE 4X4 12PLY STRL (GAUZE/BANDAGES/DRESSINGS) ×2 IMPLANT
GLOVE BIO SURGEON STRL SZ8 (GLOVE) ×4 IMPLANT
GLOVE INDICATOR 8.0 STRL GRN (GLOVE) ×2 IMPLANT
GOWN STRL REUS W/ TWL LRG LVL3 (GOWN DISPOSABLE) ×1 IMPLANT
GOWN STRL REUS W/ TWL XL LVL3 (GOWN DISPOSABLE) ×1 IMPLANT
GOWN STRL REUS W/TWL LRG LVL3 (GOWN DISPOSABLE) ×1
GOWN STRL REUS W/TWL XL LVL3 (GOWN DISPOSABLE) ×1
GRASPER SUT 15 45D LOW PRO (SUTURE) ×2 IMPLANT
IV LACTATED RINGER IRRG 3000ML (IV SOLUTION) ×2
IV LR IRRIG 3000ML ARTHROMATIC (IV SOLUTION) ×2 IMPLANT
MANIFOLD 4PT FOR NEPTUNE1 (MISCELLANEOUS) ×2 IMPLANT
MAT BLUE FLOOR 46X72 FLO (MISCELLANEOUS) ×2 IMPLANT
NEEDLE HYPO 21X1.5 SAFETY (NEEDLE) ×2 IMPLANT
PACK ARTHROSCOPY SHOULDER (MISCELLANEOUS) ×2 IMPLANT
PAD GROUND ADULT SPLIT (MISCELLANEOUS) IMPLANT
SLING ULTRA II M (MISCELLANEOUS) ×2 IMPLANT
STAPLER SKIN PROX 35W (STAPLE) ×2 IMPLANT
STRAP BODY AND KNEE 60X3 (MISCELLANEOUS) ×4 IMPLANT
SUT ETHIBOND 0 MO6 C/R (SUTURE) ×2 IMPLANT
SUT VIC AB 2-0 CT1 27 (SUTURE) ×2
SUT VIC AB 2-0 CT1 TAPERPNT 27 (SUTURE) ×2 IMPLANT
TAPE MICROFOAM 4IN (TAPE) IMPLANT
TUBING ARTHRO INFLOW-ONLY STRL (TUBING) ×2 IMPLANT
TUBING CONNECTING 10 (TUBING) ×2 IMPLANT
WAND HAND CNTRL MULTIVAC 90 (MISCELLANEOUS) ×2 IMPLANT

## 2016-01-17 NOTE — Transfer of Care (Signed)
Immediate Anesthesia Transfer of Care Note  Patient: April Black  Procedure(s) Performed: Procedure(s): LEFT ARTHROSCOPY SHOULDER WITH DEBRIDEMENT DECOMPRESSION REPAIR OF SLAP TEAR AND  BICEPS TENODESIS (Left)  Patient Location: PACU  Anesthesia Type: General, Regional  Level of Consciousness: awake, alert  and patient cooperative  Airway and Oxygen Therapy: Patient Spontanous Breathing and Patient connected to supplemental oxygen  Post-op Assessment: Post-op Vital signs reviewed, Patient's Cardiovascular Status Stable, Respiratory Function Stable, Patent Airway and No signs of Nausea or vomiting  Post-op Vital Signs: Reviewed and stable  Complications: No apparent anesthesia complications

## 2016-01-17 NOTE — Anesthesia Preprocedure Evaluation (Signed)
Anesthesia Evaluation  Patient identified by MRN, date of birth, ID band Patient awake    Reviewed: Allergy & Precautions, NPO status , Patient's Chart, lab work & pertinent test results  History of Anesthesia Complications (+) Family history of anesthesia reaction  Airway Mallampati: I  TM Distance: >3 FB Neck ROM: Full    Dental no notable dental hx.    Pulmonary    Pulmonary exam normal        Cardiovascular negative cardio ROS Normal cardiovascular exam     Neuro/Psych  Headaches, PSYCHIATRIC DISORDERS Anxiety    GI/Hepatic negative GI ROS, Neg liver ROS,   Endo/Other  negative endocrine ROSHypothyroidism   Renal/GU negative Renal ROS     Musculoskeletal  (+) Arthritis , Osteoarthritis,    Abdominal   Peds  (+) ADHD Hematology negative hematology ROS (+)   Anesthesia Other Findings   Reproductive/Obstetrics                             Anesthesia Physical Anesthesia Plan  ASA: II  Anesthesia Plan: General and Regional   Post-op Pain Management: GA combined w/ Regional for post-op pain   Induction: Intravenous  Airway Management Planned: LMA  Additional Equipment:   Intra-op Plan:   Post-operative Plan:   Informed Consent: I have reviewed the patients History and Physical, chart, labs and discussed the procedure including the risks, benefits and alternatives for the proposed anesthesia with the patient or authorized representative who has indicated his/her understanding and acceptance.     Plan Discussed with: CRNA  Anesthesia Plan Comments:         Anesthesia Quick Evaluation

## 2016-01-17 NOTE — Anesthesia Procedure Notes (Signed)
Anesthesia Regional Block:  Interscalene brachial plexus block  Pre-Anesthetic Checklist: ,, timeout performed, Correct Patient, Correct Site, Correct Laterality, Correct Procedure, Correct Position, site marked, Risks and benefits discussed,  Surgical consent,  Pre-op evaluation,  At surgeon's request and post-op pain management  Laterality: Left  Prep: chloraprep       Needles:  Injection technique: Single-shot  Needle Type: Stimiplex     Needle Length: 10cm 10 cm Needle Gauge: 21 and 21 G    Additional Needles:  Procedures: ultrasound guided (picture in chart) Interscalene brachial plexus block Narrative:  Start time: 01/17/2016 1:09 PM End time: 01/17/2016 1:17 PM Injection made incrementally with aspirations every 5 mL.  Performed by: Personally  Anesthesiologist: Harolyn RutherfordFRIEDMAN, Christene Pounds  Additional Notes: Functioning IV was confirmed and monitors applied. Ultrasound guidance: relevant anatomy identified, needle position confirmed, local anesthetic spread visualized around nerve(s)., vascular puncture avoided.  Image printed for medical record.  Negative aspiration and no paresthesias; incremental administration of local anesthetic. The patient tolerated the procedure well. Vitals signes recorded in RN notes.

## 2016-01-17 NOTE — H&P (Signed)
Paper H&P to be scanned into permanent record. H&P reviewed. No changes. 

## 2016-01-17 NOTE — Anesthesia Procedure Notes (Signed)
Procedure Name: LMA Insertion Date/Time: 01/17/2016 2:23 PM Performed by: Maryan RuedWILSON, April Brookover M Pre-anesthesia Checklist: Patient identified, Emergency Drugs available, Suction available, Patient being monitored and Timeout performed Patient Re-evaluated:Patient Re-evaluated prior to inductionOxygen Delivery Method: Circle system utilized Preoxygenation: Pre-oxygenation with 100% oxygen Intubation Type: IV induction Ventilation: Mask ventilation without difficulty LMA: LMA inserted LMA Size: 4.0

## 2016-01-17 NOTE — Anesthesia Postprocedure Evaluation (Signed)
Anesthesia Post Note  Patient: April Black  Procedure(s) Performed: Procedure(s) (LRB): LEFT ARTHROSCOPY SHOULDER WITH DEBRIDEMENT DECOMPRESSION REPAIR OF SLAP TEAR AND  BICEPS TENODESIS (Left)  Patient location during evaluation: PACU Anesthesia Type: General and Regional Level of consciousness: awake and alert and oriented Pain management: pain level controlled Vital Signs Assessment: post-procedure vital signs reviewed and stable Respiratory status: spontaneous breathing and nonlabored ventilation Cardiovascular status: stable Postop Assessment: no signs of nausea or vomiting and adequate PO intake Anesthetic complications: no    Harolyn RutherfordJoshua Marely Apgar

## 2016-01-17 NOTE — Discharge Instructions (Signed)
General Anesthesia, Adult, Care After Refer to this sheet in the next few weeks. These instructions provide you with information on caring for yourself after your procedure. Your health care provider may also give you more specific instructions. Your treatment has been planned according to current medical practices, but problems sometimes occur. Call your health care provider if you have any problems or questions after your procedure. WHAT TO EXPECT AFTER THE PROCEDURE After the procedure, it is typical to experience:  Sleepiness.  Nausea and vomiting. HOME CARE INSTRUCTIONS  For the first 24 hours after general anesthesia:  Have a responsible person with you.  Do not drive a car. If you are alone, do not take public transportation.  Do not drink alcohol.  Do not take medicine that has not been prescribed by your health care provider.  Do not sign important papers or make important decisions.  You may resume a normal diet and activities as directed by your health care provider.  Change bandages (dressings) as directed.  If you have questions or problems that seem related to general anesthesia, call the hospital and ask for the anesthetist or anesthesiologist on call. SEEK MEDICAL CARE IF:  You have nausea and vomiting that continue the day after anesthesia.  You develop a rash. SEEK IMMEDIATE MEDICAL CARE IF:   You have difficulty breathing.  You have chest pain.  You have any allergic problems.   This information is not intended to replace advice given to you by your health care provider. Make sure you discuss any questions you have with your health care provider.   Document Released: 09/02/2000 Document Revised: 06/17/2014 Document Reviewed: 09/25/2011 Elsevier Interactive Patient Education 2016 [employer]  Keep dressing dry and intact.  May shower after dressing changed on post-op day #4 (Sunday).  Cover staples with Band-Aids after drying off. Apply ice  frequently to shoulder or use polar care device from home. Take ibuprofen 800 mg three times daily with food. Take pain medication between doses of ibuprofen as needed for more severe pain. May supplement these medications with ES Tylenol and/or tramadol as necessary. Keep shoulder immobilizer on at all times except may remove for bathing purposes. Follow-up in 10-14 days or as scheduled.

## 2016-01-17 NOTE — Op Note (Signed)
01/17/2016  4:06 PM  Patient:   April Black  Pre-Op Diagnosis:   Impingement/tendinopathy with probable SLAP tear, left shoulder.  Postoperative diagnosis: Impingement/tendinopathy with SLAP tear, left shoulder.  Procedure: Arthroscopic SLAP repair, arthroscopic subacromial decompression, and mini-open biceps tenodesis, left shoulder.  Anesthesia: General LMA with interscalene block placed preoperatively by the anesthesiologist.  Surgeon:   Maryagnes Amos, MD  Assistant:   Renee Pain, PA-S  Findings: As above. The labral tear extended from the 11 to the 12:30 position. There was a Buford complex more anteriorly. The rotator cuff itself was in excellent condition, as was the biceps tendon. The articular surfaces of both the glenoid and humerus were in satisfactory condition as well.  Complications: None  Fluids:   800 cc  Estimated blood loss: 5 cc  Tourniquet time: None  Drains: None  Closure: Staples   Brief clinical note: The patient is a 50 year old female with a history of left shoulder pain. The patient's symptoms have progressed despite medications, activity modification, etc. The patient's history and examination are consistent with impingement/tendinopathy with a possible SLAP tear. These findings were confirmed by arthro/MRI scan. The patient presents at this time for definitive management of these shoulder symptoms.  Procedure: The patient underwent placement of an interscalene block by the anesthesiologist in the preoperative holding area before she was brought into the operating room and lain in the supine position. The patient then underwent general laryngeal mask anesthesia before being repositioned in the beach chair position using the beach chair positioner. The left shoulder and upper extremity were prepped with ChloraPrep solution before being draped sterilely. Preoperative antibiotics were administered. A timeout was performed to  confirm the proper surgical site before the expected portal sites and incision site were injected with 0.5% Sensorcaine with epinephrine. A posterior portal was created and the glenohumeral joint thoroughly inspected with the findings as described above. An anterior portal was created using an outside-in technique. The labrum and rotator cuff were further probed, again confirming the above-noted findings. Given that the patient was over 50 and complained of anterior shoulder pain radiating to the biceps region, some best to perform a biceps tenodesis in addition to the SLAP repair. Therefore, the biceps tendon was released from its labral attachment using the ArthroCare wand. The exposed glenoid rim was roughened with an end-cutting rasp and full-radius resector before the labrum was secured using a single Mitek BioKnotless anchor placed through a separate superolateral portal which was created using an outside-in technique. Subsequent probing demonstrated excellent stability of the repair. The ArthroCare wand was inserted and used to obtain hemostasis as well as to "anneal" the labrum superiorly and anteriorly. The instruments were removed from the joint after suctioning the excess fluid.  The camera was repositioned through the posterior portal into the subacromial space. A separate lateral portal was created using an outside-in technique. The 3.5 mm full-radius resector was introduced and used to perform a subtotal bursectomy. The ArthroCare wand was then inserted and used to remove the periosteal tissue off the undersurface of the anterior third of the acromion as well as to recess the coracoacromial ligament from its attachment along the anterior and lateral margins of the acromion. The 4.0 mm acromionizing bur was introduced and used to complete the decompression by removing the undersurface of the anterior third of the acromion. The full radius resector was reintroduced to remove any residual bony debris  before the ArthroCare wand was reintroduced to obtain hemostasis. The instruments were then removed  from the subacromial space after suctioning the excess fluid.  The bicipital groove was identified by palpation and opened for 1-1.5 cm. The biceps tendon stump was retrieved through this defect. The floor of the bicipital groove was roughened with a curet before a Biomet 2.9 mm JuggerKnot anchor was inserted. Both sets of sutures were passed through the biceps tendon and tied securely to effect the tenodesis. The bicipital sheath was reapproximated using two #0 Ethibond interrupted sutures, incorporating the biceps tendon to further reinforce the tenodesis.  The wound was copiously irrigated with sterile saline solution before the deltoid raphae was reapproximated using 2-0 Vicryl interrupted sutures. The subcutaneous tissues were closed in two layers using 2-0 Vicryl interrupted sutures before the skin was closed using staples. The portal sites also were closed using staples. A sterile bulky dressing was applied to the shoulder before the arm was placed into a shoulder immobilizer. The patient was then awakened, extubated, and returned to the recovery room in satisfactory condition after tolerating the procedure well.

## 2016-01-18 ENCOUNTER — Encounter: Payer: Self-pay | Admitting: Surgery

## 2016-05-29 ENCOUNTER — Ambulatory Visit
Admission: EM | Admit: 2016-05-29 | Discharge: 2016-05-29 | Disposition: A | Discharge status: Home / Self Care | Payer: 59 | Attending: Emergency Medicine | Admitting: Emergency Medicine

## 2016-05-29 ENCOUNTER — Encounter: Payer: Self-pay | Admitting: Emergency Medicine

## 2016-05-29 DIAGNOSIS — J019 Acute sinusitis, unspecified: Secondary | ICD-10-CM | POA: Diagnosis not present

## 2016-05-29 HISTORY — DX: Raynaud's syndrome without gangrene: I73.00

## 2016-05-29 HISTORY — DX: Asperger's syndrome: F84.5

## 2016-05-29 MED ORDER — AMOXICILLIN-POT CLAVULANATE 875-125 MG PO TABS: 1.0000 | ORAL_TABLET | Freq: Two times a day (BID) | ORAL | 0 refills | Status: AC

## 2016-05-29 NOTE — ED Triage Notes (Signed)
Productive cough since Sunday afternoon.  Low grade fever on Monday.

## 2016-05-29 NOTE — ED Provider Notes (Signed)
CSN: [id number]     Arrival date & time 05/29/16  [id number] History   First MD Initiated Contact with Patient 05/29/16 0932     Chief Complaint  Patient presents with  . Cough   (Consider location/radiation/quality/duration/timing/severity/associated sxs/prior Treatment) Patient is a well-appearing 50 y.o female, presents today for 4 days history of severe URI symptoms with fever, chills, congestion, ear pain, sinus pressure/pain over the maxillary sinuses, and coughing. Patient have tried OTC benadryl and mucinex with no relief. Patient reports the highest temp at home to be 100.0.       Past Medical History:  Diagnosis Date  . ADHD (attention deficit hyperactivity disorder)   . Anxiety   . Arthritis    hips  . Asperger's disorder   . 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 Constipation   . Family history of adverse reaction to anesthesia    mom- PONV  . Hemorrhoids   . Herpes   . Hypothyroidism   . Migraines    weather related, approx 1x/mo  . Motion sickness    boats, car-back seat  . Multinodular goiter   . Osteoporosis   . Raynaud disease    Past Surgical History:  Procedure Laterality Date  . ARTHOSCOPIC ROTAOR CUFF REPAIR    . CHOLECYSTECTOMY    . COLONOSCOPY WITH PROPOFOL N/A 06/30/2015   Procedure: COLONOSCOPY WITH PROPOFOL;  Surgeon: Wallace CullensPaul Y Oh, MD;  [facility];  Service: Gastroenterology;  Laterality: N/A;  . ESOPHAGOGASTRODUODENOSCOPY (EGD) WITH PROPOFOL N/A 06/30/2015   Procedure: ESOPHAGOGASTRODUODENOSCOPY (EGD) WITH PROPOFOL;  Surgeon: Wallace CullensPaul Y Oh, MD;  [facility];  Service: Gastroenterology;  Laterality: N/A;  . SHOULDER ARTHROSCOPY Left 01/17/2016   Procedure: LEFT ARTHROSCOPY SHOULDER WITH DEBRIDEMENT DECOMPRESSION REPAIR OF SLAP TEAR AND  BICEPS TENODESIS;  Surgeon: Christena FlakeJohn J Poggi, MD;  Location: [facility];  Service: Orthopedics;  Laterality: Left;  . SHOULDER SURGERY Bilateral   . TONSILLECTOMY      Family History  Problem Relation Age of Onset  . Heart attack Mother   . Asperger's syndrome Father   . Dementia Father   . Breast cancer Neg Hx    Social History  Substance Use Topics  . Smoking status: Never Smoker  . Smokeless tobacco: Never Used  . Alcohol use Yes   OB History    No data available     Review of Systems  Constitutional: Positive for chills, fatigue and fever. Negative for appetite change.  HENT: Positive for congestion, ear pain, rhinorrhea, sinus pain, sinus pressure and sore throat. Negative for sneezing.   Respiratory: Positive for cough. Negative for choking, shortness of breath and wheezing.   Cardiovascular: Negative for chest pain and palpitations.  Gastrointestinal: Negative for abdominal pain, nausea and vomiting.  Musculoskeletal: Positive for myalgias. Negative for arthralgias.  Skin: Negative for rash.  Neurological: Positive for light-headedness. Negative for dizziness and headaches.    Allergies  Codeine; Gluten meal; and Adhesive [tape]  Home Medications   Prior to Admission medications   Medication Sig Start Date End Date Taking? Authorizing Provider  alendronate (FOSAMAX) 70 MG tablet Take 70 mg by mouth once a week. Take with a full glass of water on an empty stomach.   Yes Historical Provider, MD  amphetamine-dextroamphetamine (ADDERALL) 20 MG tablet Take 20 mg by mouth daily.   Yes Historical Provider, MD  propranolol (INDERAL) 10 MG tablet Take 10 mg by mouth 3 (three) times  daily.   Yes Historical Provider, MD  sertraline (ZOLOFT) 50 MG tablet Take 50 mg by mouth daily.   Yes Historical Provider, MD  amoxicillin-clavulanate (AUGMENTIN) 875-125 MG tablet Take 1 tablet by mouth every 12 (twelve) hours. 05/29/16 06/05/16  Lucia Estelle, NP  Azelastine-Fluticasone 137-50 MCG/ACT SUSP Place into the nose.    Historical Provider, MD  BIOTIN PO Take by mouth daily.    Historical Provider, MD  Calcium Carbonate-Vitamin D (CALCIUM-CARB 600 +  D PO) Take by mouth.    Historical Provider, MD  Cholecalciferol 2000 units CAPS Take by mouth.    Historical Provider, MD  Cyanocobalamin (VITAMIN B-12 PO) Take by mouth daily.    Historical Provider, MD  Flaxseed, Linseed, (FLAX SEED OIL PO) Take by mouth daily.    Historical Provider, MD  Glucosamine Sulfate 500 MG TABS Take by mouth daily.    Historical Provider, MD  Magnesium Oxide 400 (240 Mg) MG TABS Take by mouth daily.    Historical Provider, MD  Multiple Vitamin (MULTIVITAMIN) capsule Take 1 capsule by mouth daily.    Historical Provider, MD  ondansetron (ZOFRAN ODT) 4 MG disintegrating tablet Take 1 tablet (4 mg total) by mouth every 8 (eight) hours as needed for nausea or vomiting. 01/17/16   Christena Flake, MD  oxyCODONE (ROXICODONE) 5 MG immediate release tablet Take 1-2 tablets (5-10 mg total) by mouth every 4 (four) hours as needed for moderate pain or severe pain. 01/17/16   Christena Flake, MD  traMADol (ULTRAM) 50 MG tablet Take by mouth every 6 (six) hours as needed.    Historical Provider, MD  valACYclovir (VALTREX) 1000 MG tablet Take 1,000 mg by mouth 2 (two) times daily.    Historical Provider, MD   Meds Ordered and Administered this Visit  Medications - No data to display  BP 119/72 (BP Location: Left Arm)   Pulse 67   Temp 98.1 F (36.7 C) (Tympanic)   Resp 18   Ht 5\' 6"  (1.676 m)   Wt 165 lb (74.8 kg)   SpO2 98%   BMI 26.63 kg/m  No data found.   Physical Exam  Constitutional: She is oriented to person, place, and time. She appears well-developed and well-nourished.  HENT:  Head: Normocephalic and atraumatic.  Right Ear: External ear normal.  Left Ear: External ear normal.  Nose: Nose normal.  Mouth/Throat: Oropharynx is clear and moist. No oropharyngeal exudate.  TM normal bilaterally. No tenderness to percuss over frontal and maxillary sinuses  Eyes: Conjunctivae and EOM are normal. Pupils are equal, round, and reactive to light.  Neck: Normal range of  motion. Neck supple.  Cardiovascular: Normal rate, regular rhythm and normal heart sounds.   Pulmonary/Chest: Effort normal and breath sounds normal. No respiratory distress. She has no wheezes.  Abdominal: Soft. Bowel sounds are normal. She exhibits no distension and no mass. There is no tenderness. There is no guarding.  Lymphadenopathy:    She has no cervical adenopathy.  Neurological: She is alert and oriented to person, place, and time.  Skin: No rash noted.  Psychiatric: She has a normal mood and affect.  Nursing note and vitals reviewed.   Urgent Care Course   Clinical Course     Procedures (including critical care time)  Labs Review Labs Reviewed - No data to display  Imaging Review No results found.     MDM   1. Acute non-recurrent sinusitis, unspecified location    Patient has severe URI symptons for  the past 4 days. Suspecting to have a sinus infection. Will tx with Augmentin BID x 7 days. Take flonase 2 sprays each nostril daily for the next 5-7 days. May take sudafed as well for congestion; but be sure to drink plenty of water if you do take Sudafed. Follow up with your primary care provider if you do not improve. Discharge instruction given.     Lucia EstelleFeng Tully Mcinturff, NP 05/29/16 [id number]

## 2016-05-29 NOTE — Discharge Instructions (Signed)
Take the antibiotic as prescribed. You have Flonase at home; use Flonase 2 sprays each nostril daily for the next 5-7 days. Follow up with your primary care provider if you do not improve. May take sudafed behind the counter but drinks a lot of water if you do end up taking the sudafed for your congestion.

## 2016-09-03 DIAGNOSIS — M79672 Pain in left foot: Secondary | ICD-10-CM | POA: Diagnosis not present

## 2016-10-31 ENCOUNTER — Other Ambulatory Visit: Payer: Self-pay | Admitting: Obstetrics and Gynecology

## 2016-10-31 DIAGNOSIS — M8580 Other specified disorders of bone density and structure, unspecified site: Secondary | ICD-10-CM | POA: Diagnosis not present

## 2016-10-31 DIAGNOSIS — Z1211 Encounter for screening for malignant neoplasm of colon: Secondary | ICD-10-CM | POA: Diagnosis not present

## 2016-10-31 DIAGNOSIS — Z01411 Encounter for gynecological examination (general) (routine) with abnormal findings: Secondary | ICD-10-CM | POA: Diagnosis not present

## 2016-10-31 DIAGNOSIS — Z1231 Encounter for screening mammogram for malignant neoplasm of breast: Secondary | ICD-10-CM

## 2016-10-31 DIAGNOSIS — Z0000 Encounter for general adult medical examination without abnormal findings: Secondary | ICD-10-CM | POA: Diagnosis not present

## 2016-12-27 DIAGNOSIS — M810 Age-related osteoporosis without current pathological fracture: Secondary | ICD-10-CM | POA: Diagnosis not present

## 2016-12-30 ENCOUNTER — Ambulatory Visit
Admission: RE | Admit: 2016-12-30 | Discharge: 2016-12-30 | Disposition: A | Discharge status: Home / Self Care | Payer: 59 | Source: Ambulatory Visit | Attending: Obstetrics and Gynecology | Admitting: Obstetrics and Gynecology

## 2016-12-30 DIAGNOSIS — Z1231 Encounter for screening mammogram for malignant neoplasm of breast: Secondary | ICD-10-CM | POA: Diagnosis not present

## 2017-03-04 DIAGNOSIS — M810 Age-related osteoporosis without current pathological fracture: Secondary | ICD-10-CM | POA: Diagnosis not present

## 2017-03-31 DIAGNOSIS — N760 Acute vaginitis: Secondary | ICD-10-CM | POA: Diagnosis not present

## 2017-03-31 DIAGNOSIS — N950 Postmenopausal bleeding: Secondary | ICD-10-CM | POA: Diagnosis not present

## 2017-04-19 ENCOUNTER — Other Ambulatory Visit: Payer: Self-pay

## 2017-04-19 ENCOUNTER — Ambulatory Visit
Admission: EM | Admit: 2017-04-19 | Discharge: 2017-04-19 | Disposition: A | Discharge status: Home / Self Care | Payer: 59 | Attending: Family Medicine | Admitting: Family Medicine

## 2017-04-19 DIAGNOSIS — R3915 Urgency of urination: Secondary | ICD-10-CM

## 2017-04-19 DIAGNOSIS — R300 Dysuria: Secondary | ICD-10-CM | POA: Diagnosis not present

## 2017-04-19 DIAGNOSIS — B373 Candidiasis of vulva and vagina: Secondary | ICD-10-CM | POA: Diagnosis not present

## 2017-04-19 DIAGNOSIS — N898 Other specified noninflammatory disorders of vagina: Secondary | ICD-10-CM | POA: Diagnosis not present

## 2017-04-19 DIAGNOSIS — B3731 Acute candidiasis of vulva and vagina: Secondary | ICD-10-CM

## 2017-04-19 LAB — WET PREP, GENITAL
CLUE CELLS WET PREP: NONE SEEN
SPERM: NONE SEEN
TRICH WET PREP: NONE SEEN
WBC, Wet Prep HPF POC: NONE SEEN
Yeast Wet Prep HPF POC: NONE SEEN

## 2017-04-19 LAB — URINALYSIS, COMPLETE (UACMP) WITH MICROSCOPIC
Bilirubin Urine: NEGATIVE
GLUCOSE, UA: NEGATIVE mg/dL
Ketones, ur: NEGATIVE mg/dL
NITRITE: NEGATIVE
PH: 7 (ref 5.0–8.0)
Protein, ur: NEGATIVE mg/dL
SPECIFIC GRAVITY, URINE: 1.01 (ref 1.005–1.030)
Squamous Epithelial / LPF: NONE SEEN

## 2017-04-19 MED ORDER — FLUCONAZOLE 150 MG PO TABS: ORAL_TABLET | ORAL | 0 refills | Status: DC

## 2017-04-19 NOTE — ED Provider Notes (Signed)
[facility]    CSN: [id number] Arrival date & time: 04/19/17  [id number]  History   Chief Complaint Chief Complaint  Patient presents with  . Vaginal Itching    HPI  51 year old female presents with vaginal discharge.  Patient states that she was recently treated for vaginitis secondary to bacterial vaginosis.  She states that her symptoms improved.  However, she subsequently developed white vaginal discharge, vaginal irritation and burning.  She states this is been going on for approximately 8 days.  She is used Monistat without improvement.  She states that her symptoms seem to be worsening.  She states that she has had some "bladder spasms" as well.  She reports some urgency associated with this.  She also has some dysuria but is unsure of its relate her vaginal issues.  Symptoms are severe.  She has had no relief.  No other associated symptoms.  No other complaints at this time.  Past Medical History:  Diagnosis Date  . ADHD (attention deficit hyperactivity disorder)   . Anxiety   . Arthritis    hips  . Asperger's disorder   . 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 Constipation   . Family history of adverse reaction to anesthesia    mom- PONV  . Hemorrhoids   . Herpes   . Hypothyroidism   . Migraines    weather related, approx 1x/mo  . Motion sickness    boats, car-back seat  . Multinodular goiter   . Osteoporosis   . Raynaud disease    Past Surgical History:  Procedure Laterality Date  . ARTHOSCOPIC ROTAOR CUFF REPAIR    . CHOLECYSTECTOMY    . SHOULDER SURGERY Bilateral   . TONSILLECTOMY      OB History    No data available     Home Medications    Prior to Admission medications   Medication Sig Start Date End Date Taking? Authorizing Provider  alendronate (FOSAMAX) 70 MG tablet Take 70 mg by mouth once a week. Take with a full glass of water on an empty stomach.   Yes [provider]    amphetamine-dextroamphetamine (ADDERALL) 20 MG tablet Take 20 mg by mouth daily.   Yes [provider]  Azelastine-Fluticasone 137-50 MCG/ACT SUSP Place into the nose.   Yes [provider]  BIOTIN PO Take by mouth daily.   Yes [provider]  Calcium Carbonate-Vitamin D (CALCIUM-CARB 600 + D PO) Take by mouth.   Yes [provider]  Cholecalciferol 2000 units CAPS Take by mouth.   Yes [provider]  Cyanocobalamin (VITAMIN B-12 PO) Take by mouth daily.   Yes [provider]  cyclobenzaprine (FLEXERIL) 10 MG tablet Take 10 mg 3 (three) times daily as needed by mouth for muscle spasms.   Yes [provider]  Flaxseed, Linseed, (FLAX SEED OIL PO) Take by mouth daily.   Yes [provider]  Glucosamine Sulfate 500 MG TABS Take by mouth daily.   Yes [provider]  Magnesium Oxide 400 (240 Mg) MG TABS Take by mouth daily.   Yes [provider]  Multiple Vitamin (MULTIVITAMIN) capsule Take 1 capsule by mouth daily.   Yes [provider]  propranolol (INDERAL) 10 MG tablet Take 10 mg by mouth 3 (three) times daily.   Yes [provider]  sertraline (ZOLOFT) 50 MG tablet Take 50 mg by mouth daily.   Yes [provider]  valACYclovir (VALTREX) 1000 MG tablet Take 1,000 mg by mouth 2 (two) times daily.   Yes [provider]  fluconazole (DIFLUCAN) 150 MG tablet 1 tablet once. Repeat dosing in 72 hours. 04/19/17   Tommie Sams, DO    Family History Family History  Problem Relation Age of Onset  . Heart attack Mother   . Asperger's syndrome Father   . Dementia Father   . Breast cancer Neg Hx    Social History Social History   Tobacco Use  . Smoking status: Never Smoker  . Smokeless tobacco: Never Used  Substance Use Topics  . Alcohol use: Yes  . Drug use: No   Allergies   Codeine; Gluten meal; and Adhesive [tape]   Review of Systems Review of Systems   Constitutional: Negative.   Genitourinary: Positive for dysuria, urgency, vaginal discharge and vaginal pain.   Physical Exam Triage Vital Signs ED Triage Vitals  Enc Vitals Group     BP 04/19/17 0952 124/80     Pulse Rate 04/19/17 0952 68     Resp 04/19/17 0952 17     Temp 04/19/17 0952 98.1 F (36.7 C)     Temp Source 04/19/17 0952 Oral     SpO2 04/19/17 0952 100 %     Weight 04/19/17 0953 155 lb (70.3 kg)     Height 04/19/17 0953 5\' 5"  (1.651 m)     Head Circumference --      Peak Flow --      Pain Score 04/19/17 0953 2     Pain Loc --      Pain Edu? --      Excl. in GC? --    Updated Vital Signs BP 124/80 (BP Location: Left Arm)   Pulse 68   Temp 98.1 F (36.7 C) (Oral)   Resp 17   Ht 5\' 5"  (1.651 m)   Wt 155 lb (70.3 kg)   SpO2 100%   BMI 25.79 kg/m   Physical Exam  Constitutional: She is oriented to person, place, and time. She appears well-developed. No distress.  HENT:  Head: Normocephalic and atraumatic.  Cardiovascular: Normal rate and regular rhythm.  No murmur heard. Pulmonary/Chest: Effort normal and breath sounds normal. No respiratory distress. She has no wheezes. She has no rales.  Abdominal: Soft. She exhibits no distension. There is no tenderness.  Genitourinary:  Genitourinary Comments: Pelvic Exam: External: Profuse discharge noted. Vagina: Thick white discharge noted. Cervix: normal without lesions or masses. Wet Prep obtained.   Neurological: She is alert and oriented to person, place, and time.  Psychiatric: She has a normal mood and affect. Her behavior is normal.  Vitals reviewed.    UC Treatments / Results  Labs (all labs ordered are listed, but only abnormal results are displayed) Labs Reviewed  URINALYSIS, COMPLETE (UACMP) WITH MICROSCOPIC - Abnormal; Notable for the following components:      Result Value   Color, Urine STRAW (*)    APPearance HAZY (*)    Hgb urine dipstick TRACE (*)    Leukocytes, UA LARGE (*)     Bacteria, UA FEW (*)    All other components within normal limits  WET PREP, GENITAL  URINE CULTURE    EKG  EKG Interpretation None       Radiology No results found.  Procedures Procedures (including critical care time)  Medications Ordered in UC Medications - No data to display   Initial Impression / Assessment and Plan / UC Course  I have reviewed the triage vital signs and the nursing notes.  Pertinent labs & imaging results that were available during my care of the patient were reviewed by me and considered in my medical decision making (see chart for details).     51 year old female presents with a clinical exam consistent with yeast vaginitis.  Her wet prep was negative.  Treating with Diflucan.  Her urinalysis was abnormal as well.  I suspect this is from her vaginal discharge.  Awaiting culture. Final Clinical Impressions(s) / UC Diagnoses   Final diagnoses:  Yeast vaginitis    ED Discharge Orders        Ordered    fluconazole (DIFLUCAN) 150 MG tablet     04/19/17 1047     Controlled Substance Prescriptions [place] Controlled Substance Registry consulted? Not Applicable   Tommie SamsCook, Consandra Laske G, DO 04/19/17 1110

## 2017-04-19 NOTE — ED Triage Notes (Signed)
Patient complains of vaginal itching, burning and swelling. Patient states that she was treated for BV on 03/31/2017. Patient states that these symptoms are different. Patient states that she has been trying monistat without relief.

## 2017-04-22 LAB — URINE CULTURE: Culture: 40000 — AB

## 2017-04-29 DIAGNOSIS — Z23 Encounter for immunization: Secondary | ICD-10-CM | POA: Diagnosis not present

## 2017-10-02 DIAGNOSIS — E785 Hyperlipidemia, unspecified: Secondary | ICD-10-CM | POA: Diagnosis not present

## 2017-10-02 DIAGNOSIS — R202 Paresthesia of skin: Secondary | ICD-10-CM | POA: Diagnosis not present

## 2017-11-19 DIAGNOSIS — G43709 Chronic migraine without aura, not intractable, without status migrainosus: Secondary | ICD-10-CM | POA: Diagnosis not present

## 2017-11-24 DIAGNOSIS — K5909 Other constipation: Secondary | ICD-10-CM | POA: Diagnosis not present

## 2017-11-24 DIAGNOSIS — R1310 Dysphagia, unspecified: Secondary | ICD-10-CM | POA: Diagnosis not present

## 2017-11-24 DIAGNOSIS — K219 Gastro-esophageal reflux disease without esophagitis: Secondary | ICD-10-CM | POA: Diagnosis not present

## 2017-11-26 DIAGNOSIS — N950 Postmenopausal bleeding: Secondary | ICD-10-CM | POA: Diagnosis not present

## 2017-11-26 DIAGNOSIS — Z01419 Encounter for gynecological examination (general) (routine) without abnormal findings: Secondary | ICD-10-CM | POA: Diagnosis not present

## 2017-11-27 ENCOUNTER — Other Ambulatory Visit: Payer: Self-pay | Admitting: Obstetrics and Gynecology

## 2017-11-27 DIAGNOSIS — Z1231 Encounter for screening mammogram for malignant neoplasm of breast: Secondary | ICD-10-CM

## 2017-12-08 DIAGNOSIS — K219 Gastro-esophageal reflux disease without esophagitis: Secondary | ICD-10-CM | POA: Diagnosis not present

## 2017-12-08 DIAGNOSIS — K210 Gastro-esophageal reflux disease with esophagitis: Secondary | ICD-10-CM | POA: Diagnosis not present

## 2017-12-08 DIAGNOSIS — K3189 Other diseases of stomach and duodenum: Secondary | ICD-10-CM | POA: Diagnosis not present

## 2017-12-08 DIAGNOSIS — R1310 Dysphagia, unspecified: Secondary | ICD-10-CM | POA: Diagnosis not present

## 2017-12-27 DIAGNOSIS — Z1211 Encounter for screening for malignant neoplasm of colon: Secondary | ICD-10-CM | POA: Diagnosis not present

## 2017-12-30 DIAGNOSIS — N950 Postmenopausal bleeding: Secondary | ICD-10-CM | POA: Diagnosis not present

## 2018-01-01 ENCOUNTER — Ambulatory Visit
Admission: RE | Admit: 2018-01-01 | Discharge: 2018-01-01 | Disposition: A | Discharge status: Home / Self Care | Payer: 59 | Source: Ambulatory Visit | Attending: Obstetrics and Gynecology | Admitting: Obstetrics and Gynecology

## 2018-01-01 DIAGNOSIS — Z1231 Encounter for screening mammogram for malignant neoplasm of breast: Secondary | ICD-10-CM | POA: Diagnosis not present

## 2018-01-07 ENCOUNTER — Other Ambulatory Visit: Payer: Self-pay | Admitting: Obstetrics and Gynecology

## 2018-01-07 DIAGNOSIS — R928 Other abnormal and inconclusive findings on diagnostic imaging of breast: Secondary | ICD-10-CM

## 2018-01-07 DIAGNOSIS — N6489 Other specified disorders of breast: Secondary | ICD-10-CM

## 2018-01-08 ENCOUNTER — Ambulatory Visit
Admission: RE | Admit: 2018-01-08 | Discharge: 2018-01-08 | Disposition: A | Discharge status: Home / Self Care | Payer: 59 | Source: Ambulatory Visit | Attending: Obstetrics and Gynecology | Admitting: Obstetrics and Gynecology

## 2018-01-08 DIAGNOSIS — R928 Other abnormal and inconclusive findings on diagnostic imaging of breast: Secondary | ICD-10-CM

## 2018-01-08 DIAGNOSIS — N6489 Other specified disorders of breast: Secondary | ICD-10-CM | POA: Diagnosis not present

## 2018-03-05 DIAGNOSIS — M79605 Pain in left leg: Secondary | ICD-10-CM | POA: Diagnosis not present

## 2018-03-05 DIAGNOSIS — M79604 Pain in right leg: Secondary | ICD-10-CM | POA: Diagnosis not present

## 2018-03-31 DIAGNOSIS — N83201 Unspecified ovarian cyst, right side: Secondary | ICD-10-CM | POA: Diagnosis not present

## 2018-04-02 DIAGNOSIS — N83202 Unspecified ovarian cyst, left side: Secondary | ICD-10-CM | POA: Diagnosis not present

## 2018-04-02 DIAGNOSIS — N83201 Unspecified ovarian cyst, right side: Secondary | ICD-10-CM | POA: Diagnosis not present

## 2018-08-10 NOTE — H&P (Signed)
April Black is a 53 y.o. female here for Discuss sono .here for follow up for bilateral ovarian cyst with more spcific concerns of the left cyst with a nodular component . H/o PMB 11/2017 and underwent a TVUS which showed a normal stripe but a sold nodular component in the right ovary. A repeat u/s in 10/19 failed to show an increase in the solid component . A ca-125 was done and was normal . Pt is asymptomatic     Past Medical History:  has a past medical history of ADHD (attention deficit hyperactivity disorder), Anxiety, Asperger syndrome, Bladder spasms, Constipation, Diverticulosis (06/30/2015), GERD (gastroesophageal reflux disease), Hemorrhoids, Herpes, Hives, Hypothyroidism, Osteoporosis, and Thyroid goiter.  Past Surgical History:  has a past surgical history that includes Arthroscopic Rotator Cuff Repair; Tonsillectomy; Colonoscopy (06/30/2015); egd (06/30/2015); Cholecystectomy; Esophagogastrodoudenoscopy (N/A, 08/01/2015); Arthroscopic SLAP Repair, arthroscopic subacromial decompression, and mini-open biceps tendoesis,left shoulder  (Left, 01/17/2016); and egd (12/08/2017). Family History: family history includes Celiac disease in her maternal aunt; Coronary Artery Disease (Blocked arteries around heart) in an other family member; Heart disease in an other family member; High blood pressure (Hypertension) in her mother; Myocardial Infarction (Heart attack) in her father and mother; Obesity in an other family member; Stroke in an other family member. Social History:  reports that she has never smoked. She has never used smokeless tobacco. She reports that she does not drink alcohol or use drugs. OB/GYN History:          OB History    Gravida  0   Para  0   Term  0   Preterm  0   AB  0   Living  0     SAB  0   TAB  0   Ectopic  0   Molar      Multiple  0   Live Births             Allergies: is allergic to oxycodone; codeine; gluten; propranolol; adhesive;  and sulfa (sulfonamide antibiotics). Medications:  Current Outpatient Medications:  .  acetaminophen (TYLENOL EXTRA STRENGTH) 500 MG tablet, Take 1,000 mg by mouth every 6 (six) hours as needed for Pain., Disp: , Rfl:  .  alendronate (FOSAMAX) 70 MG tablet, TAKE 1 TABLET BY MOUTH  EVERY 14 DAYS, Disp: 6 tablet, Rfl: 3 .  biotin 1 mg tablet, Take 1,000 mcg by mouth 3 (three) times daily., Disp: , Rfl:  .  calcium carbonate-vitamin D3 (OS-CAL 500+D) 500 mg(1,250mg ) -200 unit tablet, Take 1 tablet by mouth 2 (two) times daily with meals., Disp: , Rfl:  .  cholecalciferol (VITAMIN D3) 2,000 unit tablet, Take 5,000 Units by mouth daily., Disp: , Rfl:  .  dextroamphetamine-amphetamine (ADDERALL) 20 mg tablet, Take 2 tabs every day., Disp: 60 tablet, Rfl: 0 .  diltiazem (TIAZAC) 120 MG ER capsule, TAKE 1 CAPSULE BY MOUTH  ONCE DAILY, Disp: 90 capsule, Rfl: 1 .  fluticasone propionate (FLONASE) 50 mcg/actuation nasal spray, Place 1 spray into both nostrils once daily, Disp: , Rfl:  .  glucosamine sulfate (GLUCOSAMINE) 500 mg Tab, Take by mouth once daily., Disp: , Rfl:  .  ibuprofen (ADVIL,MOTRIN) 200 MG tablet, Take 200 mg by mouth every 6 (six) hours as needed for Pain, Disp: , Rfl:  .  magnesium oxide (MAG-OX) 400 mg tablet, Take 400 mg by mouth once daily., Disp: , Rfl:  .  multivitamin tablet, Take 1 tablet by mouth daily., Disp: , Rfl:  .  prenatal vit-CA-MIN-FE-FA (  P-D NATAL PLUS, KPN ORAL) tablet, Take 1 tablet by mouth once daily., Disp: , Rfl:  .  sertraline (ZOLOFT) 50 MG tablet, Take 1 tablet (50 mg total) by mouth once daily, Disp: 90 tablet, Rfl: 3 .  valACYclovir (VALTREX) 1000 MG tablet, Take 1 tablet (1,000 mg total) by mouth once daily, Disp: 90 tablet, Rfl: 1 .  azelastine-fluticasone (DYMISTA) 137-50 mcg/spray Spry, 1 spray by Nasal route 2 (two) times daily after meals. (Patient not taking: Reported on 06/22/2018), Disp: 69 g, Rfl: 3 .  cyclobenzaprine (FLEXERIL) 10 MG tablet, TAKE 1  TABLET BY MOUTH 3  TIMES DAILY AS NEEDED FOR  MUSCLE SPASMS FOR UP TO 10  DAYS. (Patient not taking: Reported on 06/22/2018), Disp: 30 tablet, Rfl: 3 .  pantoprazole (PROTONIX) 20 MG DR tablet, Take 1 tablet (20 mg total) by mouth once daily (Patient not taking: Reported on 06/22/2018), Disp: 90 tablet, Rfl: 1  Review of Systems: General:                      No fatigue or weight loss Eyes:                           No vision changes Ears:                            No hearing difficulty Respiratory:                No cough or shortness of breath Pulmonary:                  No asthma or shortness of breath Cardiovascular:           No chest pain, palpitations, dyspnea on exertion Gastrointestinal:          No abdominal bloating, chronic diarrhea, constipations, masses, pain or hematochezia Genitourinary:             No hematuria, dysuria, abnormal vaginal discharge, pelvic pain, Menometrorrhagia Lymphatic:                   No swollen lymph nodes Musculoskeletal:         No muscle weakness Neurologic:                  No extremity weakness, syncope, seizure disorder Psychiatric:                  No history of depression, delusions or suicidal/homicidal ideation    Exam:      Vitals:   08/05/18 1601  BP: 121/85  Pulse: 75    Body mass index is 25.66 kg/m.  WDWN white/  female in NAD   Lungs: CTA  CV : RRR without murmur    Neck:  no thyromegaly Abdomen: soft , no mass, normal active bowel sounds,  non-tender, no rebound tenderness Pelvic: tanner stage 5 ,  External genitalia: vulva /labia no lesions Urethra: no prolapse Vagina: normal physiologic d/c Cervix: no lesions, no cervical motion tenderness   Uterus: normal size shape and contour, non-tender Adnexa: no mass,  non-tender   Rectovaginal:  u/s today :  Uterus retroverted  Fibroid seen: fundal=4cm  Endometrium=8.79mm  No free fluid seen  Lt ovarian complex septated cyst=3.1cm; septations=0.43cm,  0.16cm, 0.16cm  Rt ovary contains 2 cysts: 1)complex=2.5cm; solid component within=1.64 x 0.83 x 1.98cm 2)simple=1.5cm     Impression:  The encounter diagnosis was Bilateral ovarian cysts.  Right ovarian cyst with solid nodular appearance and increase in size from last u/s .  Plan:   I spoke to the pt and expressed my concern that with interval growth and the nodular appearance I think she should have her ovaries removed given a chance there is occult cancer.  She agrees  I did speak to her about hormonal effects after  BSO . Schedule for L/S BSO       Return if symptoms worsen or fail to improve, for preop.  Vilma Prader, MD

## 2018-08-13 ENCOUNTER — Other Ambulatory Visit: Payer: 59

## 2018-08-13 ENCOUNTER — Encounter
Admission: RE | Admit: 2018-08-13 | Discharge: 2018-08-13 | Disposition: A | Discharge status: Home / Self Care | Payer: Managed Care, Other (non HMO) | Source: Ambulatory Visit | Attending: Obstetrics and Gynecology | Admitting: Obstetrics and Gynecology

## 2018-08-13 ENCOUNTER — Other Ambulatory Visit: Payer: Self-pay

## 2018-08-13 DIAGNOSIS — Z01812 Encounter for preprocedural laboratory examination: Secondary | ICD-10-CM | POA: Insufficient documentation

## 2018-08-13 LAB — BASIC METABOLIC PANEL
ANION GAP: 4 — AB (ref 5–15)
BUN: 12 mg/dL (ref 6–20)
CO2: 29 mmol/L (ref 22–32)
CREATININE: 0.63 mg/dL (ref 0.44–1.00)
Calcium: 9.5 mg/dL (ref 8.9–10.3)
Chloride: 107 mmol/L (ref 98–111)
GLUCOSE: 109 mg/dL — AB (ref 70–99)
Potassium: 3.7 mmol/L (ref 3.5–5.1)
Sodium: 140 mmol/L (ref 135–145)

## 2018-08-13 LAB — CBC
HCT: 41.1 % (ref 36.0–46.0)
Hemoglobin: 13.9 g/dL (ref 12.0–15.0)
MCH: 32 pg (ref 26.0–34.0)
MCHC: 33.8 g/dL (ref 30.0–36.0)
MCV: 94.5 fL (ref 80.0–100.0)
Platelets: 171 10*3/uL (ref 150–400)
RBC: 4.35 MIL/uL (ref 3.87–5.11)
RDW: 12.1 % (ref 11.5–15.5)
WBC: 7.1 10*3/uL (ref 4.0–10.5)
nRBC: 0 % (ref 0.0–0.2)

## 2018-08-13 LAB — TYPE AND SCREEN
ABO/RH(D): A NEG
Antibody Screen: NEGATIVE

## 2018-08-13 NOTE — Patient Instructions (Addendum)
Your procedure is scheduled on: Monday 08/17/18.  Report to DAY SURGERY DEPARTMENT LOCATED ON 2ND FLOOR MEDICAL MALL ENTRANCE. To find out your arrival time please call [phone] between 1PM - 3PM on Friday 08/14/18.   Remember: Instructions that are not followed completely may result in serious medical risk, up to and including death, or upon the discretion of your surgeon and anesthesiologist your surgery may need to be rescheduled.      _X__ 1. Do not eat food after midnight the night before your procedure.                 No gum chewing or hard candies. You may drink clear liquids up to 2 hours                 before you are scheduled to arrive for your surgery- DO NOT drink clear                 liquids within 2 hours of the start of your surgery.                 Clear Liquids include:  water, apple juice without pulp, clear carbohydrate                 drink such as Clearfast or Gatorade, Black Coffee or Tea (Do not add                 milk or creamer to coffee or tea).   __X__2.  On the morning of surgery brush your teeth with toothpaste and water, you may rinse your mouth with mouthwash if you wish.  Do not swallow any toothpaste or mouthwash.      _X__ 3.  No Alcohol for 24 hours before or after surgery.   __X__4.  Notify your doctor if there is any change in your medical condition      (cold, fever, infections).      Do not wear jewelry, make-up, hairpins, clips or nail polish. Do not wear lotions, powders, or perfumes.  Do not shave 48 hours prior to surgery. Men may shave face and neck. Do not bring valuables to the hospital.     [facility] is not responsible for any belongings or valuables.   Contacts, dentures/partials or body piercings may not be worn into surgery. Bring a case for your contacts, glasses or hearing aids, a denture cup will be supplied.   Patients discharged the day of surgery will not be allowed to drive home.    Please read over the  following fact sheets that you were given:   MRSA Information   __X__ Take these medicines the morning of surgery with A SIP OF WATER:     1. amphetamine-dextroamphetamine (ADDERALL) 20 MG tablet  2. sertraline (ZOLOFT) 50 MG tablet  3. cetirizine (ZYRTEC) 10 MG tablet  4. valACYclovir (VALTREX) 1000 MG tablet if needed      __X__ Use CHG Soap as directed   __X__ Stop Blood Thinners Coumadin/Plavix/Xarelto/Pleta/Pradaxa/Eliquis/Effient/Aspirin    __X__ Stop Anti-inflammatories 7 days before surgery such as Advil, Ibuprofen, Motrin, BC or Goodies Powder, Naprosyn, Naproxen, Aleve, Aspirin, Meloxicam. May take Tylenol if needed for pain or discomfort.    __X__ Please don't start taking any new herbal supplements before your surgery.

## 2018-08-16 ENCOUNTER — Encounter: Payer: Self-pay | Admitting: Anesthesiology

## 2018-08-17 ENCOUNTER — Encounter
Admission: RE | Disposition: A | Discharge status: Home / Self Care | Payer: Self-pay | Source: Home / Self Care | Attending: Obstetrics and Gynecology

## 2018-08-17 ENCOUNTER — Ambulatory Visit: Payer: Managed Care, Other (non HMO) | Admitting: Anesthesiology

## 2018-08-17 ENCOUNTER — Other Ambulatory Visit: Payer: Self-pay

## 2018-08-17 ENCOUNTER — Ambulatory Visit
Admission: RE | Admit: 2018-08-17 | Discharge: 2018-08-17 | Disposition: A | Discharge status: Home / Self Care | Payer: Managed Care, Other (non HMO) | Attending: Obstetrics and Gynecology | Admitting: Obstetrics and Gynecology

## 2018-08-17 DIAGNOSIS — N3289 Other specified disorders of bladder: Secondary | ICD-10-CM | POA: Diagnosis not present

## 2018-08-17 DIAGNOSIS — M1990 Unspecified osteoarthritis, unspecified site: Secondary | ICD-10-CM | POA: Insufficient documentation

## 2018-08-17 DIAGNOSIS — Z823 Family history of stroke: Secondary | ICD-10-CM | POA: Diagnosis not present

## 2018-08-17 DIAGNOSIS — Z79899 Other long term (current) drug therapy: Secondary | ICD-10-CM | POA: Diagnosis not present

## 2018-08-17 DIAGNOSIS — Z791 Long term (current) use of non-steroidal anti-inflammatories (NSAID): Secondary | ICD-10-CM | POA: Diagnosis not present

## 2018-08-17 DIAGNOSIS — Z91048 Other nonmedicinal substance allergy status: Secondary | ICD-10-CM | POA: Insufficient documentation

## 2018-08-17 DIAGNOSIS — N801 Endometriosis of ovary: Secondary | ICD-10-CM | POA: Insufficient documentation

## 2018-08-17 DIAGNOSIS — M810 Age-related osteoporosis without current pathological fracture: Secondary | ICD-10-CM | POA: Insufficient documentation

## 2018-08-17 DIAGNOSIS — N83201 Unspecified ovarian cyst, right side: Secondary | ICD-10-CM | POA: Diagnosis present

## 2018-08-17 DIAGNOSIS — N802 Endometriosis of fallopian tube: Secondary | ICD-10-CM | POA: Diagnosis not present

## 2018-08-17 DIAGNOSIS — E049 Nontoxic goiter, unspecified: Secondary | ICD-10-CM | POA: Diagnosis not present

## 2018-08-17 DIAGNOSIS — K219 Gastro-esophageal reflux disease without esophagitis: Secondary | ICD-10-CM | POA: Insufficient documentation

## 2018-08-17 DIAGNOSIS — Z8379 Family history of other diseases of the digestive system: Secondary | ICD-10-CM | POA: Diagnosis not present

## 2018-08-17 DIAGNOSIS — R51 Headache: Secondary | ICD-10-CM | POA: Diagnosis not present

## 2018-08-17 DIAGNOSIS — N838 Other noninflammatory disorders of ovary, fallopian tube and broad ligament: Secondary | ICD-10-CM | POA: Diagnosis not present

## 2018-08-17 DIAGNOSIS — F419 Anxiety disorder, unspecified: Secondary | ICD-10-CM | POA: Insufficient documentation

## 2018-08-17 DIAGNOSIS — K5790 Diverticulosis of intestine, part unspecified, without perforation or abscess without bleeding: Secondary | ICD-10-CM | POA: Diagnosis not present

## 2018-08-17 DIAGNOSIS — E039 Hypothyroidism, unspecified: Secondary | ICD-10-CM | POA: Diagnosis not present

## 2018-08-17 DIAGNOSIS — Z8249 Family history of ischemic heart disease and other diseases of the circulatory system: Secondary | ICD-10-CM | POA: Insufficient documentation

## 2018-08-17 DIAGNOSIS — K5900 Constipation, unspecified: Secondary | ICD-10-CM | POA: Diagnosis not present

## 2018-08-17 DIAGNOSIS — F909 Attention-deficit hyperactivity disorder, unspecified type: Secondary | ICD-10-CM | POA: Insufficient documentation

## 2018-08-17 DIAGNOSIS — F845 Asperger's syndrome: Secondary | ICD-10-CM | POA: Insufficient documentation

## 2018-08-17 DIAGNOSIS — Z882 Allergy status to sulfonamides status: Secondary | ICD-10-CM | POA: Diagnosis not present

## 2018-08-17 DIAGNOSIS — Z9049 Acquired absence of other specified parts of digestive tract: Secondary | ICD-10-CM | POA: Insufficient documentation

## 2018-08-17 DIAGNOSIS — K9041 Non-celiac gluten sensitivity: Secondary | ICD-10-CM | POA: Insufficient documentation

## 2018-08-17 DIAGNOSIS — Z885 Allergy status to narcotic agent status: Secondary | ICD-10-CM | POA: Diagnosis not present

## 2018-08-17 HISTORY — PX: LAPAROSCOPIC BILATERAL SALPINGO OOPHERECTOMY: SHX5890

## 2018-08-17 LAB — POCT PREGNANCY, URINE: Preg Test, Ur: NEGATIVE

## 2018-08-17 LAB — ABO/RH: ABO/RH(D): A NEG

## 2018-08-17 SURGERY — SALPINGO-OOPHORECTOMY, BILATERAL, LAPAROSCOPIC
Anesthesia: General | Laterality: Bilateral

## 2018-08-17 MED ORDER — ONDANSETRON HCL 4 MG/2ML IJ SOLN: 4.0000 mg | Freq: Once | INTRAMUSCULAR | Status: DC | PRN

## 2018-08-17 MED ORDER — DEXAMETHASONE SODIUM PHOSPHATE 10 MG/ML IJ SOLN
INTRAMUSCULAR | Status: DC | PRN
  Administered 2018-08-17: 8 mg via INTRAVENOUS

## 2018-08-17 MED ORDER — LACTATED RINGERS IV SOLN
INTRAVENOUS | Status: DC
  Administered 2018-08-17: 1000 mL via INTRAVENOUS
  Administered 2018-08-17 (×2): via INTRAVENOUS

## 2018-08-17 MED ORDER — LIDOCAINE HCL (CARDIAC) PF 100 MG/5ML IV SOSY
PREFILLED_SYRINGE | INTRAVENOUS | Status: DC | PRN
  Administered 2018-08-17: 80 mg via INTRAVENOUS

## 2018-08-17 MED ORDER — GABAPENTIN 300 MG PO CAPS
300.0000 mg | ORAL_CAPSULE | ORAL | Status: AC
  Administered 2018-08-17: 300 mg via ORAL

## 2018-08-17 MED ORDER — ROCURONIUM BROMIDE 50 MG/5ML IV SOLN
INTRAVENOUS | Status: AC
  Filled 2018-08-17: qty 1

## 2018-08-17 MED ORDER — HYDROCODONE-ACETAMINOPHEN 5-325 MG PO TABS
ORAL_TABLET | ORAL | Status: AC
  Filled 2018-08-17: qty 1

## 2018-08-17 MED ORDER — GABAPENTIN 300 MG PO CAPS
ORAL_CAPSULE | ORAL | Status: AC
  Filled 2018-08-17: qty 1

## 2018-08-17 MED ORDER — FENTANYL CITRATE (PF) 100 MCG/2ML IJ SOLN: 25.0000 ug | INTRAMUSCULAR | Status: DC | PRN

## 2018-08-17 MED ORDER — GLYCOPYRROLATE 0.2 MG/ML IJ SOLN
INTRAMUSCULAR | Status: DC | PRN
  Administered 2018-08-17: 0.2 mg via INTRAVENOUS

## 2018-08-17 MED ORDER — CELECOXIB 200 MG PO CAPS
ORAL_CAPSULE | ORAL | Status: AC
  Filled 2018-08-17: qty 2

## 2018-08-17 MED ORDER — LIDOCAINE HCL (PF) 2 % IJ SOLN
INTRAMUSCULAR | Status: AC
  Filled 2018-08-17: qty 10

## 2018-08-17 MED ORDER — FENTANYL CITRATE (PF) 100 MCG/2ML IJ SOLN
INTRAMUSCULAR | Status: AC
  Filled 2018-08-17: qty 2

## 2018-08-17 MED ORDER — DEXAMETHASONE SODIUM PHOSPHATE 10 MG/ML IJ SOLN
INTRAMUSCULAR | Status: AC
  Filled 2018-08-17: qty 1

## 2018-08-17 MED ORDER — MIDAZOLAM HCL 2 MG/2ML IJ SOLN
INTRAMUSCULAR | Status: AC
  Filled 2018-08-17: qty 2

## 2018-08-17 MED ORDER — LACTATED RINGERS IV SOLN: INTRAVENOUS | Status: DC

## 2018-08-17 MED ORDER — HYDROCODONE-ACETAMINOPHEN 5-325 MG PO TABS
1.0000 | ORAL_TABLET | Freq: Once | ORAL | Status: AC
  Administered 2018-08-17: 1 via ORAL

## 2018-08-17 MED ORDER — ONDANSETRON HCL 4 MG/2ML IJ SOLN
INTRAMUSCULAR | Status: DC | PRN
  Administered 2018-08-17: 4 mg via INTRAVENOUS

## 2018-08-17 MED ORDER — ROCURONIUM BROMIDE 100 MG/10ML IV SOLN
INTRAVENOUS | Status: DC | PRN
  Administered 2018-08-17: 10 mg via INTRAVENOUS
  Administered 2018-08-17: 30 mg via INTRAVENOUS

## 2018-08-17 MED ORDER — BUPIVACAINE HCL (PF) 0.5 % IJ SOLN
INTRAMUSCULAR | Status: AC
  Filled 2018-08-17: qty 30

## 2018-08-17 MED ORDER — FENTANYL CITRATE (PF) 100 MCG/2ML IJ SOLN
INTRAMUSCULAR | Status: DC | PRN
  Administered 2018-08-17 (×4): 25 ug via INTRAVENOUS

## 2018-08-17 MED ORDER — EPHEDRINE SULFATE 50 MG/ML IJ SOLN
INTRAMUSCULAR | Status: DC | PRN
  Administered 2018-08-17: 5 mg via INTRAVENOUS
  Administered 2018-08-17: 10 mg via INTRAVENOUS
  Administered 2018-08-17 (×3): 5 mg via INTRAVENOUS

## 2018-08-17 MED ORDER — ACETAMINOPHEN 500 MG PO TABS
ORAL_TABLET | ORAL | Status: AC
  Filled 2018-08-17: qty 2

## 2018-08-17 MED ORDER — SCOPOLAMINE 1 MG/3DAYS TD PT72
1.0000 | MEDICATED_PATCH | TRANSDERMAL | Status: DC
  Administered 2018-08-17: 1.5 mg via TRANSDERMAL

## 2018-08-17 MED ORDER — CELECOXIB 200 MG PO CAPS
400.0000 mg | ORAL_CAPSULE | ORAL | Status: AC
  Administered 2018-08-17: 400 mg via ORAL

## 2018-08-17 MED ORDER — SCOPOLAMINE 1 MG/3DAYS TD PT72
MEDICATED_PATCH | TRANSDERMAL | Status: AC
  Filled 2018-08-17: qty 1

## 2018-08-17 MED ORDER — MIDAZOLAM HCL 2 MG/2ML IJ SOLN
1.0000 mg | Freq: Once | INTRAMUSCULAR | Status: AC
  Administered 2018-08-17: 1 mg via INTRAVENOUS

## 2018-08-17 MED ORDER — SUGAMMADEX SODIUM 200 MG/2ML IV SOLN
INTRAVENOUS | Status: DC | PRN
  Administered 2018-08-17: 150 mg via INTRAVENOUS

## 2018-08-17 MED ORDER — ONDANSETRON HCL 4 MG/2ML IJ SOLN
INTRAMUSCULAR | Status: AC
  Filled 2018-08-17: qty 2

## 2018-08-17 MED ORDER — MIDAZOLAM HCL 2 MG/2ML IJ SOLN
INTRAMUSCULAR | Status: AC
  Administered 2018-08-17: 1 mg via INTRAVENOUS
  Filled 2018-08-17: qty 2

## 2018-08-17 MED ORDER — BUPIVACAINE HCL 0.5 % IJ SOLN
INTRAMUSCULAR | Status: DC | PRN
  Administered 2018-08-17: 10 mL

## 2018-08-17 MED ORDER — ACETAMINOPHEN 500 MG PO TABS
1000.0000 mg | ORAL_TABLET | ORAL | Status: AC
  Administered 2018-08-17: 1000 mg via ORAL

## 2018-08-17 MED ORDER — PROPOFOL 10 MG/ML IV BOLUS
INTRAVENOUS | Status: DC | PRN
  Administered 2018-08-17: 150 mg via INTRAVENOUS
  Administered 2018-08-17: 20 mg via INTRAVENOUS

## 2018-08-17 MED ORDER — GLYCOPYRROLATE 0.2 MG/ML IJ SOLN
INTRAMUSCULAR | Status: AC
  Filled 2018-08-17: qty 1

## 2018-08-17 SURGICAL SUPPLY — 38 items
BAG URINE DRAINAGE (UROLOGICAL SUPPLIES) ×3 IMPLANT
BLADE SURG SZ11 CARB STEEL (BLADE) ×3 IMPLANT
CANISTER SUCT 1200ML W/VALVE (MISCELLANEOUS) ×3 IMPLANT
CATH ROBINSON RED A/P 16FR (CATHETERS) ×3 IMPLANT
CHLORAPREP W/TINT 26 (MISCELLANEOUS) ×3 IMPLANT
CLOSURE WOUND 1/4X4 (GAUZE/BANDAGES/DRESSINGS) ×1
COVER WAND RF STERILE (DRAPES) ×3 IMPLANT
DRSG TEGADERM 2-3/8X2-3/4 SM (GAUZE/BANDAGES/DRESSINGS) ×3 IMPLANT
GLOVE BIO SURGEON STRL SZ8 (GLOVE) ×9 IMPLANT
GOWN STRL REUS W/ TWL LRG LVL3 (GOWN DISPOSABLE) ×2 IMPLANT
GOWN STRL REUS W/ TWL XL LVL3 (GOWN DISPOSABLE) ×1 IMPLANT
GOWN STRL REUS W/TWL LRG LVL3 (GOWN DISPOSABLE) ×4
GOWN STRL REUS W/TWL XL LVL3 (GOWN DISPOSABLE) ×2
GRASPER SUT TROCAR 14GX15 (MISCELLANEOUS) ×3 IMPLANT
IRRIGATION STRYKERFLOW (MISCELLANEOUS) ×1 IMPLANT
IRRIGATOR STRYKERFLOW (MISCELLANEOUS) ×3
IV NS 1000ML (IV SOLUTION) ×2
IV NS 1000ML BAXH (IV SOLUTION) ×1 IMPLANT
KIT TURNOVER CYSTO (KITS) ×3 IMPLANT
LABEL OR SOLS (LABEL) ×3 IMPLANT
NS IRRIG 500ML POUR BTL (IV SOLUTION) ×3 IMPLANT
PACK GYN LAPAROSCOPIC (MISCELLANEOUS) ×3 IMPLANT
PAD OB MATERNITY 4.3X12.25 (PERSONAL CARE ITEMS) ×3 IMPLANT
PAD PREP 24X41 OB/GYN DISP (PERSONAL CARE ITEMS) ×3 IMPLANT
POUCH SPECIMEN RETRIEVAL 10MM (ENDOMECHANICALS) ×3 IMPLANT
SET TUBE SMOKE EVAC HIGH FLOW (TUBING) ×3 IMPLANT
SHEARS HARMONIC ACE PLUS 36CM (ENDOMECHANICALS) ×3 IMPLANT
SPONGE GAUZE 2X2 8PLY STER LF (GAUZE/BANDAGES/DRESSINGS) ×1
SPONGE GAUZE 2X2 8PLY STRL LF (GAUZE/BANDAGES/DRESSINGS) ×2 IMPLANT
STRIP CLOSURE SKIN 1/4X4 (GAUZE/BANDAGES/DRESSINGS) ×2 IMPLANT
SUT VIC AB 0 CT1 36 (SUTURE) ×6 IMPLANT
SUT VIC AB 2-0 UR6 27 (SUTURE) ×3 IMPLANT
SUT VIC AB 4-0 SH 27 (SUTURE) ×2
SUT VIC AB 4-0 SH 27XANBCTRL (SUTURE) ×1 IMPLANT
SWABSTK COMLB BENZOIN TINCTURE (MISCELLANEOUS) ×3 IMPLANT
TROCAR ENDO BLADELESS 11MM (ENDOMECHANICALS) ×3 IMPLANT
TROCAR XCEL NON-BLD 5MMX100MML (ENDOMECHANICALS) ×3 IMPLANT
TROCAR XCEL UNIV SLVE 11M 100M (ENDOMECHANICALS) ×3 IMPLANT

## 2018-08-17 NOTE — Discharge Instructions (Addendum)
Bilateral Salpingo-Oophorectomy, Care After This sheet gives you information about how to care for yourself after your procedure. Your health care provider may also give you more specific instructions. If you have problems or questions, contact your health care provider. What can I expect after the procedure? After the procedure, it is common to have:  Abdominal pain.  Some occasional vaginal bleeding (spotting).  Tiredness.  Symptoms of menopause, such as hot flashes, night sweats, or mood swings. Follow these instructions at home: Incision care   Keep your incision area and your bandage (dressing) clean and dry.  Follow instructions from your health care provider about how to take care of your incision. Make sure you: ? Wash your hands with soap and water before you change your dressing. If soap and water are not available, use hand sanitizer. ? Change your dressing as told by your health care provider. ? Leave stitches (sutures), staples, skin glue, or adhesive strips in place. These skin closures may need to stay in place for 2 weeks or longer. If adhesive strip edges start to loosen and curl up, you may trim the loose edges. Do not remove adhesive strips completely unless your health care provider tells you to do that.  Check your incision area every day for signs of infection. Check for: ? Redness, swelling, or pain. ? Fluid or blood. ? Warmth. ? Pus or a bad smell. Activity  Do not drive or use heavy machinery while taking prescription pain medicine.  Do not drive for 24 hours if you received a medicine to help you relax (sedative) during your procedure.  Take frequent, short walks throughout the day. Rest when you get tired. Ask your health care provider what activities are safe for you.  Avoid activity that requires great effort. Also, avoid heavy lifting. Do not lift anything that is heavier than 10 lbs. (4.5 kg), or the limit that your health care provider tells you,  until he or she says that it is safe to do so.  Do not douche, use tampons, or have sex until your health care provider approves. General instructions  To prevent or treat constipation while you are taking prescription pain medicine, your health care provider may recommend that you: ? Drink enough fluid to keep your urine clear or pale yellow. ? Take over-the-counter or prescription medicines. ? Eat foods that are high in fiber, such as fresh fruits and vegetables, whole grains, and beans. ? Limit foods that are high in fat and processed sugars, such as fried and sweet foods.  Take over-the-counter and prescription medicines only as told by your health care provider.  Do not take baths, swim, or use a hot tub until your health care provider approves. Ask your health care provider if you can take showers. You may only be allowed to take sponge baths for bathing.  Wear compression stockings as told by your health care provider. These stockings help to prevent blood clots and reduce swelling in your legs.  Keep all follow-up visits as told by your health care provider. This is important. Contact a health care provider if:  You have pain when you urinate.  You have pus or a bad smelling discharge coming from your vagina.  You have redness, swelling, or pain around your incision.  You have fluid or blood coming from your incision.  Your incision feels warm to the touch.  You have pus or a bad smell coming from your incision.  You have a fever.  Your incision  starts to break open.  You have pain in the abdomen, and it gets worse or does not get better when you take medicine.  You develop a rash.  You develop nausea and vomiting.  You feel lightheaded. Get help right away if:  You develop pain in your chest or leg.  You become short of breath.  You faint.  You have increased bleeding from your vagina. Summary  After the procedure, it is common to have pain, bleeding in  the vagina, and symptoms of menopause.  Follow instructions from your health care provider about how to take care of your incision.  Follow instructions from your health care provider about activities and restrictions.  Check your incision every day for signs of infection and report any symptoms to your health care provider. This information is not intended to replace advice given to you by your health care provider. Make sure you discuss any questions you have with your health care provider. Document Released: 05/27/2005 Document Revised: 09/05/2016 Document Reviewed: 07/01/2016 Elsevier Interactive Patient Education  2019 [employer]   AMBULATORY SURGERY  DISCHARGE INSTRUCTIONS   1) The drugs that you were given will stay in your system until tomorrow so for the next 24 hours you should not:  A) Drive an automobile B) Make any legal decisions C) Drink any alcoholic beverage   2) You may resume regular meals tomorrow.  Today it is better to start with liquids and gradually work up to solid foods.  You may eat anything you prefer, but it is better to start with liquids, then soup and crackers, and gradually work up to solid foods.   3) Please notify your doctor immediately if you have any unusual bleeding, trouble breathing, redness and pain at the surgery site, drainage, fever, or pain not relieved by medication.    4) Additional Instructions:   Medication(s) prescribed by Dr. Feliberto Gottron prior to surgery date.        Please contact your physician with any problems or Same Day Surgery at [phone], Monday through Friday 6 am to 4 pm, or [place] [facility] number at [phone].

## 2018-08-17 NOTE — Transfer of Care (Signed)
Immediate Anesthesia Transfer of Care Note  Patient: April Black  Procedure(s) Performed: LAPAROSCOPIC BILATERAL SALPINGO OOPHORECTOMY (Bilateral )  Patient Location: PACU  Anesthesia Type:General  Level of Consciousness: sedated  Airway & Oxygen Therapy: Patient Spontanous Breathing and Patient connected to face mask oxygen  Post-op Assessment: Report given to RN and Post -op Vital signs reviewed and stable  Post vital signs: Reviewed and stable  Last Vitals:  Vitals Value Taken Time  BP 103/63 08/17/2018  1:53 PM  Temp 36.6 C 08/17/2018  1:53 PM  Pulse 67 08/17/2018  1:56 PM  Resp 12 08/17/2018  1:56 PM  SpO2 100 % 08/17/2018  1:56 PM  Vitals shown include unvalidated device data.  Last Pain:  Vitals:   08/17/18 1050  TempSrc: Oral  PainSc: 0-No pain         Complications: No apparent anesthesia complications

## 2018-08-17 NOTE — Progress Notes (Signed)
Ready for l/s BSO .  HCG neg  All questions answered.  Ready to proceed .

## 2018-08-17 NOTE — Anesthesia Procedure Notes (Signed)
Procedure Name: Intubation Performed by: Sterlin Knightly, CRNA Pre-anesthesia Checklist: Patient identified, Patient being monitored, Timeout performed, Emergency Drugs available and Suction available Patient Re-evaluated:Patient Re-evaluated prior to induction Oxygen Delivery Method: Circle system utilized Preoxygenation: Pre-oxygenation with 100% oxygen Induction Type: IV induction Ventilation: Mask ventilation without difficulty Laryngoscope Size: Mac and 3 Grade View: Grade I Tube type: Oral Tube size: 7.0 mm Number of attempts: 1 Airway Equipment and Method: Stylet Placement Confirmation: ETT inserted through vocal cords under direct vision,  positive ETCO2 and breath sounds checked- equal and bilateral Secured at: 21 cm Tube secured with: Tape Dental Injury: Teeth and Oropharynx as per pre-operative assessment        

## 2018-08-17 NOTE — Anesthesia Preprocedure Evaluation (Signed)
Anesthesia Evaluation  Patient identified by MRN, date of birth, ID band Patient awake    Reviewed: Allergy & Precautions, NPO status , Patient's Chart, lab work & pertinent test results, reviewed documented beta blocker date and time   History of Anesthesia Complications (+) Family history of anesthesia reaction  Airway Mallampati: II  TM Distance: >3 FB     Dental  (+) Chipped   Pulmonary           Cardiovascular      Neuro/Psych  Headaches, PSYCHIATRIC DISORDERS Anxiety    GI/Hepatic   Endo/Other  Hypothyroidism   Renal/GU      Musculoskeletal  (+) Arthritis ,   Abdominal   Peds  Hematology   Anesthesia Other Findings ADD.  Reproductive/Obstetrics                             Anesthesia Physical Anesthesia Plan  ASA: II  Anesthesia Plan: General   Post-op Pain Management:    Induction: Intravenous  PONV Risk Score and Plan:   Airway Management Planned: Oral ETT  Additional Equipment:   Intra-op Plan:   Post-operative Plan:   Informed Consent: I have reviewed the patients History and Physical, chart, labs and discussed the procedure including the risks, benefits and alternatives for the proposed anesthesia with the patient or authorized representative who has indicated his/her understanding and acceptance.       Plan Discussed with: CRNA  Anesthesia Plan Comments:         Anesthesia Quick Evaluation

## 2018-08-17 NOTE — Brief Op Note (Signed)
08/17/2018  1:33 PM  PATIENT:  April Black  53 y.o. female  PRE-OPERATIVE DIAGNOSIS:  bilateral complex ovarian cyst  POST-OPERATIVE DIAGNOSIS:  bilateral complex ovarian cyst  PROCEDURE:  Procedure(s): LAPAROSCOPIC BILATERAL SALPINGO OOPHORECTOMY (Bilateral)  SURGEON:  Surgeon(s) and Role:    * Jenet Durio, Ihor Austin, MD - Primary    * Christeen Douglas, MD - Assisting  PHYSICIAN ASSISTANT:   ASSISTANTS: none   ANESTHESIA:   general  EBL:  minimal   BLOOD ADMINISTERED:none  DRAINS: none   LOCAL MEDICATIONS USED:  MARCAINE     SPECIMEN:  Source of Specimen:  bilateral tubes and ovaries  DISPOSITION OF SPECIMEN:  PATHOLOGY  COUNTS:  YES  TOURNIQUET:  * No tourniquets in log *  DICTATION: .Other Dictation: Dictation Number verbal  PLAN OF CARE: Discharge to home after PACU  PATIENT DISPOSITION:  PACU - hemodynamically stable.   Delay start of Pharmacological VTE agent (>24hrs) due to surgical blood loss or risk of bleeding: not applicable

## 2018-08-17 NOTE — Anesthesia Post-op Follow-up Note (Signed)
Anesthesia QCDR form completed.        

## 2018-08-17 NOTE — Anesthesia Postprocedure Evaluation (Signed)
Anesthesia Post Note  Patient: April Black  Procedure(s) Performed: LAPAROSCOPIC BILATERAL SALPINGO OOPHORECTOMY (Bilateral )  Patient location during evaluation: PACU Anesthesia Type: General Level of consciousness: awake and alert Pain management: pain level controlled Vital Signs Assessment: post-procedure vital signs reviewed and stable Respiratory status: spontaneous breathing, nonlabored ventilation, respiratory function stable and patient connected to nasal cannula oxygen Cardiovascular status: blood pressure returned to baseline and stable Postop Assessment: no apparent nausea or vomiting Anesthetic complications: no     Last Vitals:  Vitals:   08/17/18 1435 08/17/18 1521  BP: 115/63 113/61  Pulse: 72 74  Resp: 18 18  Temp: (!) 36.3 C   SpO2: 96% 100%    Last Pain:  Vitals:   08/17/18 1521  TempSrc:   PainSc: 3                  Tim Wilhide S

## 2018-08-17 NOTE — Op Note (Signed)
NAME: April Black, April Black [facility] MEDICAL RECORD [id number] ACCOUNT [id number] DATE OF BIRTH:1965/06/25 FACILITY: [facility] LOCATION: [facility] PHYSICIAN:Prynce Jacober Cloyde Reams, MD  OPERATIVE REPORT  DATE OF PROCEDURE:  08/17/2018  PREOPERATIVE DIAGNOSES:  Bilateral complex ovarian cyst.  POSTOPERATIVE DIAGNOSES:  Bilateral complex ovarian cyst.  PROCEDURE:  Laparoscopic bilateral salpingo-oophorectomy.  ANESTHESIA:  General endotracheal anesthesia.  SURGEON:  Jennell Corner, MD.  FIRST ASSISTANTDalbert Garnet, MD  INDICATIONS:  A 53 year old female with bilateral complex ovarian cyst.  Right ovary with a solid nodular component which is increasing in size.  Solid area measures 1.6 x 0.8 x 1.9 cm.  The patient has a normal CA-125.  DESCRIPTION OF PROCEDURE:  After adequate general endotracheal anesthesia, the patient was placed in dorsal supine position.  Legs were placed in the [facility] stirrups.  The patient's abdomen, perineum and vagina were prepped and draped in normal sterile  fashion.  Timeout was performed.  Straight catheterization of the bladder yielded 125 of clear urine.  A sponge stick was placed in the vagina to be used for uterine manipulation during the procedure.  A 5 mm infraumbilical incision was made after  injecting with Marcaine.  The 5 mm laparoscope was advanced under direct visualization with the Optiview cannula.  A second port was placed in the left lower quadrant 3 cm medial to the left anterior iliac spine.  A #11 trocar was advanced under direct  visualization.  A third port site was placed in the right lower quadrant, again 3 cm medial to the right anterior iliac spine and a 5 mm trocar was advanced under direct visualization.   Initial evaluation revealed no significant pelvic or abdominal adhesions.  Bilateral ovary slightly enlarged with the right ovary with increased vascularity noted.  Attention was directed to the patient's left fallopian tube that was  grasped with the  fimbriated end.  Harmonic scalpel was brought up to the operative field, and the infundibulopelvic ligament was cauterized and the left ovary was dissected free from the uterine attachments.  This was placed in the anchor bag and removed without  difficulty.  A similar procedure was repeated on the patient's right fallopian tube and ovary.  Again, the infundibulopelvic ligament was cauterized, transected with the Harmonic scalpel and dissection ensued with removal of the right tube and ovary.   Good hemostasis was noted bilaterally.  Right tube and ovary was removed through the left lower port site.  Pelvic washings were performed before dissection occurred.  Pictures were taken.  The patient's abdomen was hemostatic.  Pressure was lowered to 7  mmHg with good hemostasis noted.  The left lower port site fascia was closed with 3 separate sutures.  The patient's abdomen was decompressed and all skin incisions were closed with interrupted 4-0 Vicryl suture.  COMPLICATIONS:  There were no complications.  ESTIMATED BLOOD LOSS:  Minimal.  INTRAOPERATIVE FLUIDS:  1000 mL.  URINE OUTPUT:  125 mL.  DISPOSITION:  The patient was taken to recovery room in good condition.  AN/NUANCE  D:08/17/2018 T:08/17/2018 JOB:[id number]

## 2018-08-19 LAB — SURGICAL PATHOLOGY

## 2018-08-19 LAB — CYTOLOGY - NON PAP

## 2018-12-12 ENCOUNTER — Other Ambulatory Visit: Payer: Self-pay

## 2018-12-12 ENCOUNTER — Ambulatory Visit
Admission: EM | Admit: 2018-12-12 | Discharge: 2018-12-12 | Disposition: A | Discharge status: Home / Self Care | Payer: Managed Care, Other (non HMO) | Attending: Family Medicine | Admitting: Family Medicine

## 2018-12-12 DIAGNOSIS — S0502XA Injury of conjunctiva and corneal abrasion without foreign body, left eye, initial encounter: Secondary | ICD-10-CM | POA: Diagnosis not present

## 2018-12-12 MED ORDER — MOXIFLOXACIN HCL 0.5 % OP SOLN: 1.0000 [drp] | Freq: Three times a day (TID) | OPHTHALMIC | 0 refills | Status: AC

## 2018-12-12 NOTE — ED Provider Notes (Signed)
[facility]    CSN: [id number] Arrival date & time: 12/12/18  1444     History   Chief Complaint Chief Complaint  Patient presents with  . Eye Pain    HPI April Black is a 53 y.o. female.   53 yo female with a c/o left eye discomfort and irritation since yesterday. States that it "feels like there's something in my eye". Denies any trauma/injury. Denies wearing contact lenses. States that yesterday she wore eyelash mascara for the first time in while and doesn't know if maybe she got something from that in her eye.    Eye Pain    Past Medical History:  Diagnosis Date  . ADHD (attention deficit hyperactivity disorder)   . Anxiety   . Arthritis    hips  . Asperger's disorder   . 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 Constipation   . Family history of adverse reaction to anesthesia    mom- PONV  . Hemorrhoids   . Herpes   . Hypothyroidism   . Migraines    weather related, approx 1x/mo  . Motion sickness    boats, car-back seat  . Multinodular goiter   . Osteoporosis   . Raynaud disease     There are no active problems to display for this patient.   Past Surgical History:  Procedure Laterality Date  . ARTHOSCOPIC ROTAOR CUFF REPAIR    . CHOLECYSTECTOMY    . COLONOSCOPY WITH PROPOFOL N/A 06/30/2015   Procedure: COLONOSCOPY WITH PROPOFOL;  Surgeon: Wallace CullensPaul Y Oh, MD;  [facility];  Service: Gastroenterology;  Laterality: N/A;  . ESOPHAGOGASTRODUODENOSCOPY (EGD) WITH PROPOFOL N/A 06/30/2015   Procedure: ESOPHAGOGASTRODUODENOSCOPY (EGD) WITH PROPOFOL;  Surgeon: Wallace CullensPaul Y Oh, MD;  [facility];  Service: Gastroenterology;  Laterality: N/A;  . LAPAROSCOPIC BILATERAL SALPINGO OOPHERECTOMY Bilateral 08/17/2018   Procedure: LAPAROSCOPIC BILATERAL SALPINGO OOPHORECTOMY;  Surgeon: Schermerhorn, Ihor Austinhomas J, MD;  Location: [facility] ORS;  Service: Gynecology;  Laterality: Bilateral;  . SHOULDER ARTHROSCOPY Left  01/17/2016   Procedure: LEFT ARTHROSCOPY SHOULDER WITH DEBRIDEMENT DECOMPRESSION REPAIR OF SLAP TEAR AND  BICEPS TENODESIS;  Surgeon: Christena FlakeJohn J Poggi, MD;  Location: [facility];  Service: Orthopedics;  Laterality: Left;  . SHOULDER SURGERY Bilateral   . TONSILLECTOMY      OB History   No obstetric history on file.      Home Medications    Prior to Admission medications   Medication Sig Start Date End Date Taking? Authorizing Provider  alendronate (FOSAMAX) 70 MG tablet Take 70 mg by mouth every 14 (fourteen) days. Take with a full glass of water on an empty stomach.    Yes [provider]  amphetamine-dextroamphetamine (ADDERALL) 20 MG tablet Take 20 mg by mouth 2 (two) times daily.    Yes [provider]  Biotin 1000 MCG tablet Take 1,000 mcg by mouth daily.   Yes [provider]  Calcium Carb-Cholecalciferol (CALCIUM 600+D3 PO) Take 2 tablets by mouth every evening.   Yes [provider]  cetirizine (ZYRTEC) 10 MG tablet Take 10 mg by mouth daily as needed for allergies.   Yes [provider]  Cholecalciferol (VITAMIN D3) 50 MCG (2000 UT) TABS Take 4,000 Units by mouth every evening.   Yes [provider]  Cyanocobalamin (VITAMIN B-12 PO) Take 1 tablet by mouth daily.    Yes [provider]  diltiazem (DILACOR XR) 120 MG 24 hr capsule  Take 120 mg by mouth every evening.   Yes [provider]  fluticasone (FLONASE) 50 MCG/ACT nasal spray Place 1 spray into both nostrils daily.   Yes [provider]  Glucosamine-Chondroitin (COSAMIN DS PO) Take 2 tablets by mouth every evening.   Yes [provider]  guaiFENesin (TUSSIN) 100 MG/5ML liquid Take 400 mg by mouth 4 (four) times daily as needed for cough.   Yes [provider]  ibuprofen (ADVIL,MOTRIN) 200 MG tablet Take 400 mg by mouth every 8 (eight) hours as needed (pain.).   Yes [provider]  magnesium oxide (MAG-OX) 400 MG  tablet Take 400 mg by mouth every evening.   Yes [provider]  Multiple Vitamin (MULTIVITAMIN WITH MINERALS) TABS tablet Take 1 tablet by mouth daily. One-A-Day Multivitamin   Yes [provider]  phenylephrine (SUDAFED PE) 10 MG TABS tablet Take 10 mg by mouth 2 (two) times daily as needed (sinus/congestion).   Yes [provider]  sertraline (ZOLOFT) 50 MG tablet Take 50 mg by mouth daily.   Yes [provider]  valACYclovir (VALTREX) 1000 MG tablet Take 1,000 mg by mouth 2 (two) times daily as needed (flare ups).    Yes [provider]  moxifloxacin (VIGAMOX) 0.5 % ophthalmic solution Place 1 drop into the left eye 3 (three) times daily. 12/12/18   April Black, April Schuermann, MD  Prenatal Vit-Fe Fumarate-FA (PRENATAL PO) Take 1 tablet by mouth daily.    [provider]    Family History Family History  Problem Relation Age of Onset  . Heart attack Mother   . Asperger's syndrome Father   . Dementia Father   . Breast cancer Neg Hx     Social History Social History   Tobacco Use  . Smoking status: Never Smoker  . Smokeless tobacco: Never Used  Substance Use Topics  . Alcohol use: Yes  . Drug use: No     Allergies   Gluten meal, Oxycodone, and Adhesive [tape]   Review of Systems Review of Systems  Eyes: Positive for pain.     Physical Exam Triage Vital Signs ED Triage Vitals  Enc Vitals Group     BP 12/12/18 1504 128/78     Pulse Rate 12/12/18 1504 81     Resp 12/12/18 1504 15     Temp 12/12/18 1504 98.3 F (36.8 C)     Temp Source 12/12/18 1504 Oral     SpO2 12/12/18 1504 98 %     Weight 12/12/18 1501 160 lb (72.6 kg)     Height 12/12/18 1501 5' 5.5" (1.664 m)     Head Circumference --      Peak Flow --      Pain Score 12/12/18 1500 1     Pain Loc --      Pain Edu? --      Excl. in GC? --    No data found.  Updated Vital Signs BP 128/78 (BP Location: Right Arm)   Pulse 81   Temp 98.3 F (36.8 C) (Oral)    Resp 15   Ht 5' 5.5" (1.664 m)   Wt 72.6 kg   SpO2 98%   BMI 26.22 kg/m   Visual Acuity Right Eye Distance: 20/20(corrected) Left Eye Distance: 20/25(corrected) Bilateral Distance: 20/20(corrected)  Right Eye Near:   Left Eye Near:    Bilateral Near:     Physical Exam Vitals signs and nursing note reviewed.  Constitutional:      General: She  is not in acute distress.    Appearance: She is not toxic-appearing or diaphoretic.  Eyes:     General: Lids are normal. Lids are everted, no foreign bodies appreciated.        Right eye: No foreign body.        Left eye: No foreign body.     Extraocular Movements: Extraocular movements intact.     Conjunctiva/sclera:     Right eye: Right conjunctiva is not injected. No exudate.    Left eye: Left conjunctiva is injected. No exudate.    Pupils: Pupils are equal, round, and reactive to light.     Left eye: Corneal abrasion and fluorescein uptake present.   Neurological:     Mental Status: She is alert.      UC Treatments / Results  Labs (all labs ordered are listed, but only abnormal results are displayed) Labs Reviewed - No data to display  EKG   Radiology No results found.  Procedures Procedures (including critical care time)  Medications Ordered in UC Medications - No data to display  Initial Impression / Assessment and Plan / UC Course  I have reviewed the triage vital signs and the nursing notes.  Pertinent labs & imaging results that were available during my care of the patient were reviewed by me and considered in my medical decision making (see chart for details).      Final Clinical Impressions(s) / UC Diagnoses   Final diagnoses:  Abrasion of left cornea, initial encounter    ED Prescriptions    Medication Sig Dispense Auth. Provider   moxifloxacin (VIGAMOX) 0.5 % ophthalmic solution Place 1 drop into the left eye 3 (three) times daily. 3 mL Norval Gable, MD     1. diagnosis reviewed with patient  2. rx as per orders above; reviewed possible side effects, interactions, risks and benefits  3. Follow-up with eye specialist next week if no improvement  Controlled Substance Prescriptions [place] Controlled Substance Registry consulted? Not Applicable   Norval Gable, MD 12/12/18 [phone]

## 2018-12-12 NOTE — ED Triage Notes (Signed)
Patient states that she has been having left eye irritation that started last night. Patient states that she feels like something is in her eye. Patient states that she has been rubbing her eye a lot. States that she wore mascara for the first time yesterday in a while.

## 2021-11-20 ENCOUNTER — Ambulatory Visit: Payer: Managed Care, Other (non HMO) | Attending: Orthopedic Surgery | Admitting: Occupational Therapy

## 2021-11-20 ENCOUNTER — Encounter: Payer: Self-pay | Admitting: Occupational Therapy

## 2021-11-20 DIAGNOSIS — M25642 Stiffness of left hand, not elsewhere classified: Secondary | ICD-10-CM | POA: Insufficient documentation

## 2021-11-20 DIAGNOSIS — M79642 Pain in left hand: Secondary | ICD-10-CM | POA: Diagnosis present

## 2021-11-20 DIAGNOSIS — R600 Localized edema: Secondary | ICD-10-CM | POA: Diagnosis present

## 2021-11-20 DIAGNOSIS — M6281 Muscle weakness (generalized): Secondary | ICD-10-CM | POA: Diagnosis present

## 2021-11-20 NOTE — Therapy (Signed)
[facility]ORTS MEDICINE [address], [place], [address] Phone: [phone]   Fax:  [phone]  Occupational Therapy Evaluation  Patient Details  Name: April Black MRN: [id number] Date of Birth: 20-Mar-1966 Referring Provider (OT): Lasandra Beech   Encounter Date: 11/20/2021   OT End of Session - 11/20/21 1729     Visit Number 1    Number of Visits 12    Date for OT Re-Evaluation 01/01/22    OT Start Time 1445    OT Stop Time 1530    OT Time Calculation (min) 45 min    Activity Tolerance Patient tolerated treatment well    Behavior During Therapy [facility] for tasks assessed/performed             Past Medical History:  Diagnosis Date   ADHD (attention deficit hyperactivity disorder)    Anxiety    Arthritis    hips   Asperger's disorder    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 time   Constipation    Family history of adverse reaction to anesthesia    mom- PONV   Hemorrhoids    Herpes    Hypothyroidism    Migraines    weather related, approx 1x/mo   Motion sickness    boats, car-back seat   Multinodular goiter    Osteoporosis    Raynaud disease     Past Surgical History:  Procedure Laterality Date   ARTHOSCOPIC ROTAOR CUFF REPAIR     CHOLECYSTECTOMY     COLONOSCOPY WITH PROPOFOL N/A 06/30/2015   Procedure: COLONOSCOPY WITH PROPOFOL;  Surgeon: Wallace Cullens, MD;  Location[facility];  Service: Gastroenterology;  Laterality: N/A;   ESOPHAGOGASTRODUODENOSCOPY (EGD) WITH PROPOFOL N/A 06/30/2015   Procedure: ESOPHAGOGASTRODUODENOSCOPY (EGD) WITH PROPOFOL;  Surgeon: Wallace Cullens, MD;  Location: [facility] ENDOSCOPY;  Service: Gastroenterology;  Laterality: N/A;   LAPAROSCOPIC BILATERAL SALPINGO OOPHERECTOMY Bilateral 08/17/2018   Procedure: LAPAROSCOPIC BILATERAL SALPINGO OOPHORECTOMY;  Surgeon: Schermerhorn, Ihor Austin, MD;  Location: [facility] ORS;  Service: Gynecology;  Laterality:  Bilateral;   SHOULDER ARTHROSCOPY Left 01/17/2016   Procedure: LEFT ARTHROSCOPY SHOULDER WITH DEBRIDEMENT DECOMPRESSION REPAIR OF SLAP TEAR AND  BICEPS TENODESIS;  Surgeon: Christena Flake, MD;  Location: [facility];  Service: Orthopedics;  Laterality: Left;   SHOULDER SURGERY Bilateral    TONSILLECTOMY      There were no vitals filed for this visit.   Subjective Assessment - 11/20/21 1715     Subjective  My left index finger continues to be not straight, not able to bend it all the way as well as swelling and pain and tenderness.  I have to say I did take Tylenol before I came to see you    Pertinent History April Black is a 56 y.o. femaler sustaining a minimally displaced left index finger proximal phalanx fracture on 10/07/21.     Patient reports improvement in her pain, though states pain can still be annoying. Has been wearing her buddy taping. No numbness, tingling.   office visit at ortho and refer to OT    Patient Stated Goals I want to prevent future problems and want to be able to bend and straightening my left index finger to use it normally    Currently in Pain? 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 and she uses a rubber ball mouse with the left hand, housework hand sewing and Crosser presents      Edema   Edema Right second digit circumference 5.4 cm, left 6.5 cm      Left  Hand AROM   L Index  MCP 0-90 80 Degrees    L Index PIP 0-100 90 Degrees   -25   L Index DIP 0-70 50 Degrees                      OT Treatments/Exercises (OP) - 11/20/21 0001       LUE Fluidotherapy   Number Minutes Fluidotherapy 8 Minutes    LUE Fluidotherapy Location Hand    Comments 2 rotations of ice done with active range of motion tendon glides prior to review of home exercise program              Reviewed with patient after fluidotherapy home program for soft tissue massage to lateral bands of second PIP as well as soft tissue massage with palm on hand over dorsal second digit.  Rolling palm 20 reps over read roller for soft tissue massage over flexors. Use silicone sleeve for compression for nighttime as well as during the day to decrease edema Fitted with a buddy strap to use during the day on and off 2 hours on 2 hours off to increase range of motion and decrease stiffness of second digit.  Patient to do 2 or 3 times a day contrast followed by extension of PIP, tendon glides with focus on lumbrical fist with MCP flexion PIP extension, blocked intrinsic a fist and composite fist to highlighter pain-free 12 reps.           OT Long Term Goals - 11/20/21 1721       OT LONG TERM GOAL #1   Title Patient to be independent in the home program to decrease edema as well as pain to increase PIP extension to 0 to put hand in glove or pocket.    Baseline Increase edema of 1 cm at second digit compared to the the right, PIP extension -25 degrees with tenderness at PIP pain 2-3/10    Time 3    Period Weeks    Status New    Target Date 12/11/21      OT LONG TERM GOAL #2   Title Patient left second digit flexion improved for patient to touch palm without increased symptoms of edema and pain to hold cylinder objects without increase symptoms    Baseline Left second MCP flexion 80 degrees, PIP 90 degrees DIP 50 degrees pain and tenderness at PIP, edema increased by  1 cm not able to use second digit with composite fisting    Time 5  Period Weeks    Status New    Target Date 12/25/21      OT LONG TERM GOAL #3   Title Left grip and prehension strength increased to more than 75% compared to the right without increased symptoms to pull and push door, use utensils and pull up pants without increased symptoms    Baseline Patient 6 weeks post injury increase swelling of 1 cm increased pain and limited motion we will assess grip strength and prehension    Time 6    Period Weeks    Status New    Target Date 01/01/22                   Plan - 11/20/21 1719     Clinical Impression Statement Patient present at OT evaluation with a diagnosis of left second digit proximal phalanges nondisplaced fracture.  Patient reports she was first immobilized and then put in a buddy strap.  Patient presented OT evaluation with a centimeter of increased swelling in second digit as well as hyperextension at the MCP and flexor contracture at PIP.  Patient show increased stiffness for PIP extension as well as flexion at MCP, PIP and DIP stiffness.  Patient report pain about a 2-3/10 with active range of motion as well as tenderness over PIP joint.  Patient limited in functional use of left hand in ADLs and IADLs with increased edema, pain and stiffness with limited strength.  Patient can benefit from skilled OT services    OT Occupational Profile and History Problem Focused Assessment - Including review of records relating to presenting problem    Occupational performance deficits (Please refer to evaluation for details): ADL's;IADL's;Work;Play;Leisure;Social Participation    Body Structure / Function / Physical Skills ADL;Decreased knowledge of precautions;Flexibility;ROM;IADL;Edema;UE functional use;Pain;Dexterity;Strength    Rehab Potential Good    Clinical Decision Making Limited treatment options, no task modification necessary    Comorbidities Affecting Occupational  Performance: None    Modification or Assistance to Complete Evaluation  No modification of tasks or assist necessary to complete eval    OT Frequency 2x / week    OT Duration 6 weeks    OT Treatment/Interventions Self-care/ADL training;Fluidtherapy;DME and/or AE instruction;Splinting;Therapeutic activities;Contrast Bath;Therapeutic exercise;Passive range of motion;Paraffin;Manual Therapy;Patient/family education    Consulted and Agree with Plan of Care Patient             Patient will benefit from skilled therapeutic intervention in order to improve the following deficits and impairments:   Body Structure / Function / Physical Skills: ADL, Decreased knowledge of precautions, Flexibility, ROM, IADL, Edema, UE functional use, Pain, Dexterity, Strength       Visit Diagnosis: Stiffness of left hand, not elsewhere classified - Plan: Ot plan of care cert/re-cert  Localized edema - Plan: Ot plan of care cert/re-cert  Pain in left hand - Plan: Ot plan of care cert/re-cert  Muscle weakness (generalized) - Plan: Ot plan of care cert/re-cert    Problem List There are no problems to display for this patient.   Oletta Cohn, OTR/L,CLT 11/20/2021, 5:31 PM  [place] [facility] PHYSICAL AND SPORTS MEDICINE [address], [place], [address] Phone: [phone]   Fax:  [phone]  Name: April Black MRN: [id number] Date of Birth: Mar 09, 1966

## 2021-11-20 NOTE — Therapy (Deleted)
[facility] [address] [place], [address] Phone: [phone]   Fax:  [phone]  Occupational Therapy Evaluation  Patient Details  Name: April Black MRN: [id number] Date of Birth: [birth year] Referring Provider (OT): Tamala Julian   Encounter Date: 11/20/2021    Past Medical History:  Diagnosis Date   ADHD (attention deficit hyperactivity disorder)    Anxiety    Arthritis    hips   Asperger's disorder    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 time   Constipation    Family history of adverse reaction to anesthesia    mom- PONV   Hemorrhoids    Herpes    Hypothyroidism    Migraines    weather related, approx 1x/mo   Motion sickness    boats, car-back seat   Multinodular goiter    Osteoporosis    Raynaud disease     Past Surgical History:  Procedure Laterality Date   ARTHOSCOPIC ROTAOR CUFF REPAIR     CHOLECYSTECTOMY     COLONOSCOPY WITH PROPOFOL N/A 06/30/2015   Procedure: COLONOSCOPY WITH PROPOFOL;  Surgeon: Hulen Luster, MD;  Location: [facility] ENDOSCOPY;  Service: Gastroenterology;  Laterality: N/A;   ESOPHAGOGASTRODUODENOSCOPY (EGD) WITH PROPOFOL N/A 06/30/2015   Procedure: ESOPHAGOGASTRODUODENOSCOPY (EGD) WITH PROPOFOL;  Surgeon: Hulen Luster, MD;  Location: [facility] ENDOSCOPY;  Service: Gastroenterology;  Laterality: N/A;   LAPAROSCOPIC BILATERAL SALPINGO OOPHERECTOMY Bilateral 08/17/2018   Procedure: LAPAROSCOPIC BILATERAL SALPINGO OOPHORECTOMY;  Surgeon: Schermerhorn, Gwen Her, MD;  Location: [facility] ORS;  Service: Gynecology;  Laterality: Bilateral;   SHOULDER ARTHROSCOPY Left 01/17/2016   Procedure: LEFT ARTHROSCOPY SHOULDER WITH DEBRIDEMENT DECOMPRESSION REPAIR OF SLAP TEAR AND  BICEPS TENODESIS;  Surgeon: Corky Mull, MD;  Location: [facility];  Service: Orthopedics;  Laterality: Left;   SHOULDER SURGERY Bilateral    TONSILLECTOMY      There were no vitals  filed for this visit.   Subjective Assessment - 11/20/21 1715     Subjective  My left index finger continues to be not straight, not able to bend it all the way as well as swelling and pain and tenderness.  I have to say I did take Tylenol before I came to see you    Pertinent History April Black is a 56 y.o. femaler sustaining a minimally displaced left index finger proximal phalanx fracture on 10/07/21.     Patient reports improvement in her pain, though states pain can still be annoying. Has been wearing her buddy taping. No numbness, tingling.   office visit at ortho and refer to OT    Patient Stated Goals I want to prevent future problems and want to be able to bend and straightening my left index finger to use it normally    Currently in Pain? 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 and she uses a rubber ball mouse with the left hand, housework hand sewing and Crosser presents      Edema   Edema Right second digit circumference 5.4 cm, left 6.5 cm      Left Hand AROM   L Index  MCP 0-90 80 Degrees    L Index PIP 0-100 90 Degrees   -25   L Index DIP 0-70 50 Degrees                      OT Treatments/Exercises (OP) - 11/20/21 0001       LUE Fluidotherapy   Number Minutes Fluidotherapy 8 Minutes    LUE Fluidotherapy Location Hand    Comments 2 rotations of ice done with  active range of motion tendon glides prior to review of home exercise program                  Reviewed with patient after fluidotherapy home program for soft tissue massage to lateral bands of second PIP as well as soft tissue massage with palm on hand over dorsal second digit.  Rolling palm 20 reps over read roller for soft tissue massage over flexors. Use silicone sleeve for compression for nighttime as well as during the day to decrease edema Fitted with a buddy strap to use during the day on and off 2 hours on 2 hours off to increase range of motion and decrease stiffness of second digit.  Patient to do 2 or 3 times a day contrast followed by extension of PIP, tendon glides with focus on lumbrical fist with MCP flexion PIP extension, blocked intrinsic a fist and composite fist to highlighter pain-free 12 reps.          OT Long Term Goals - 11/20/21 1721       OT LONG TERM GOAL #1   Title Patient to be independent in the home program to decrease edema as well as pain to increase PIP extension to 0 to put hand in glove or pocket.    Baseline Increase edema of 1 cm at second digit compared to the the right, PIP extension -25 degrees with tenderness at PIP pain 2-3/10    Time 3    Period Weeks    Status New    Target Date 12/11/21      OT LONG TERM GOAL #2   Title Patient left second digit flexion improved for patient to touch palm without increased symptoms of edema and pain to hold cylinder objects without increase symptoms    Baseline Left second MCP flexion 80 degrees, PIP 90 degrees DIP 50 degrees pain and tenderness at PIP, edema increased by 1 cm not able to use second digit with composite fisting    Time 5    Period Weeks    Status New    Target Date 12/25/21      OT LONG TERM GOAL #3   Title Left grip and prehension strength increased to more than 75% compared to the right without increased symptoms to pull and push door, use utensils and pull up pants without  increased symptoms    Baseline Patient 6 weeks post injury increase swelling of 1 cm increased pain and limited motion we will assess grip  strength and prehension    Time 6    Period Weeks    Status New    Target Date 01/01/22                   Plan - 11/20/21 1719     Clinical Impression Statement Patient present at OT evaluation with a diagnosis of left second digit proximal phalanges nondisplaced fracture.  Patient reports she was first immobilized and then put in a buddy strap.  Patient presented OT evaluation with a centimeter of increased swelling in second digit as well as hyperextension at the MCP and flexor contracture at PIP.  Patient show increased stiffness for PIP extension as well as flexion at MCP, PIP and DIP stiffness.  Patient report pain about a 2-3/10 with active range of motion as well as tenderness over PIP joint.  Patient limited in functional use of left hand in ADLs and IADLs with increased edema, pain and stiffness with limited strength.  Patient can benefit from skilled OT services    OT Occupational Profile and History Problem Focused Assessment - Including review of records relating to presenting problem    Occupational performance deficits (Please refer to evaluation for details): ADL's;IADL's;Work;Play;Leisure;Social Participation    Body Structure / Function / Physical Skills ADL;Decreased knowledge of precautions;Flexibility;ROM;IADL;Edema;UE functional use;Pain;Dexterity;Strength    Rehab Potential Good    Clinical Decision Making Limited treatment options, no task modification necessary    Comorbidities Affecting Occupational Performance: None    Modification or Assistance to Complete Evaluation  No modification of tasks or assist necessary to complete eval    OT Frequency 2x / week    OT Duration 6 weeks    OT Treatment/Interventions Self-care/ADL training;Fluidtherapy;DME and/or AE instruction;Splinting;Therapeutic activities;Contrast  Bath;Therapeutic exercise;Passive range of motion;Paraffin;Manual Therapy;Patient/family education    Consulted and Agree with Plan of Care Patient             Patient will benefit from skilled therapeutic intervention in order to improve the following deficits and impairments:   Body Structure / Function / Physical Skills: ADL, Decreased knowledge of precautions, Flexibility, ROM, IADL, Edema, UE functional use, Pain, Dexterity, Strength       Visit Diagnosis: Stiffness of left hand, not elsewhere classified - Plan: Ot plan of care cert/re-cert  Localized edema - Plan: Ot plan of care cert/re-cert  Pain in left hand - Plan: Ot plan of care cert/re-cert  Muscle weakness (generalized) - Plan: Ot plan of care cert/re-cert    Problem List There are no problems to display for this patient.   Rosalyn Gess, OTR/L,CLT 11/20/2021, 5:27 PM  [facility] [address], [place], [address] Phone: [phone]   Fax:  [phone]  Name: April Black MRN: [id number] Date of Birth: 06/24/1965

## 2021-11-26 ENCOUNTER — Ambulatory Visit: Payer: Managed Care, Other (non HMO) | Admitting: Occupational Therapy

## 2021-11-26 DIAGNOSIS — M6281 Muscle weakness (generalized): Secondary | ICD-10-CM

## 2021-11-26 DIAGNOSIS — M79642 Pain in left hand: Secondary | ICD-10-CM

## 2021-11-26 DIAGNOSIS — M25642 Stiffness of left hand, not elsewhere classified: Secondary | ICD-10-CM

## 2021-11-26 DIAGNOSIS — R600 Localized edema: Secondary | ICD-10-CM

## 2021-11-26 NOTE — Therapy (Signed)
[facility] [address] [place], [address] Phone: [phone]   Fax:  [phone]  Occupational Therapy Treatment  Patient Details  Name: April Black MRN: [id number] Date of Birth: [birth year] Referring Provider (OT): Tamala Julian   Encounter Date: 11/26/2021   OT End of Session - 11/26/21 1531     Visit Number 2    Number of Visits 12    Date for OT Re-Evaluation 01/01/22    OT Start Time [id number]    OT Stop Time 1555    OT Time Calculation (min) 39 min    Activity Tolerance Patient tolerated treatment well    Behavior During Therapy [facility] for tasks assessed/performed             Past Medical History:  Diagnosis Date   ADHD (attention deficit hyperactivity disorder)    Anxiety    Arthritis    hips   Asperger's disorder    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 time   Constipation    Family history of adverse reaction to anesthesia    mom- PONV   Hemorrhoids    Herpes    Hypothyroidism    Migraines    weather related, approx 1x/mo   Motion sickness    boats, car-back seat   Multinodular goiter    Osteoporosis    Raynaud disease     Past Surgical History:  Procedure Laterality Date   ARTHOSCOPIC ROTAOR CUFF REPAIR     CHOLECYSTECTOMY     COLONOSCOPY WITH PROPOFOL N/A 06/30/2015   Procedure: COLONOSCOPY WITH PROPOFOL;  Surgeon: Hulen Luster, MD;  Location: [facility] ENDOSCOPY;  Service: Gastroenterology;  Laterality: N/A;   ESOPHAGOGASTRODUODENOSCOPY (EGD) WITH PROPOFOL N/A 06/30/2015   Procedure: ESOPHAGOGASTRODUODENOSCOPY (EGD) WITH PROPOFOL;  Surgeon: Hulen Luster, MD;  Location: [facility] ENDOSCOPY;  Service: Gastroenterology;  Laterality: N/A;   LAPAROSCOPIC BILATERAL SALPINGO OOPHERECTOMY Bilateral 08/17/2018   Procedure: LAPAROSCOPIC BILATERAL SALPINGO OOPHORECTOMY;  Surgeon: Schermerhorn, Gwen Her, MD;  Location: [facility] ORS;  Service: Gynecology;  Laterality:  Bilateral;   SHOULDER ARTHROSCOPY Left 01/17/2016   Procedure: LEFT ARTHROSCOPY SHOULDER WITH DEBRIDEMENT DECOMPRESSION REPAIR OF SLAP TEAR AND  BICEPS TENODESIS;  Surgeon: Corky Mull, MD;  Location: [facility];  Service: Orthopedics;  Laterality: Left;   SHOULDER SURGERY Bilateral    TONSILLECTOMY      There were no vitals filed for this visit.   Subjective Assessment - 11/26/21 1530     Subjective  I am doing better I done everything you told me.  My swelling is better, my motion is better    Pertinent History April Black is a 56 y.o. femaler sustaining a minimally displaced left index finger proximal phalanx fracture on 10/07/21.     Patient reports improvement in her pain, though states pain can still be annoying. Has been wearing her buddy taping. No numbness, tingling.   office visit at ortho and refer to OT    Patient Stated Goals I want to prevent future problems and want to be able to bend and straightening my left index finger to use it normally    Currently in Pain? Yes    Pain Score 1     Pain Location Finger (Comment which one)    Pain Orientation Left    Pain Descriptors / Indicators Tender;Tightness    Pain Type Acute pain    Pain Onset More than a month ago  Pain Frequency Intermittent                [facility] OT Assessment - 11/26/21 0001       Left Hand AROM   L Index  MCP 0-90 90 Degrees    L Index PIP 0-100 95 Degrees   -20 PROM 0 ext   L Index DIP 0-70 60 Degrees              Patient made great progress since last week on decrease edema, decreased pain as well as increased flexion more than extension at PIP. Edema improved to less than half a centimeter compared to the other hand.          OT Treatments/Exercises (OP) - 11/26/21 0001       LUE Fluidotherapy   Number Minutes Fluidotherapy 8 Minutes    LUE Fluidotherapy Location Hand    Comments decrease stiffness - prior to ROM a           After fluidotherapy done soft  tissue massage to lateral bands of second PIP as well as soft tissue massage using [place] #2 for sweeping over volar second digit as well as palm prior to range of motion.    Reviewed with patient doing rolling palm 20 reps over read roller for soft tissue massage over flexors. Use silicone sleeve for compression for nighttime as well as during the day to decrease edema.  2 new ones provided Continue with a buddy strap to use during the day on and off 2 hours on 2 hours off to increase range of motion and decrease stiffness of second digit.   Patient continues to do about 3 times a day contrast prior to range of motion and home exercises  extension of PIP done on table-add silicone sleeve under volar DIP to provide somewhat of a hyperextension of PIP when doing passive range of motion for PIP  tendon glides with focus on lumbrical fist with MCP flexion PIP extension, blocked intrinsic a fist and into extension with blocking at dorsal proximal phalanges  and composite fist to highlighter and palm this date with again pressure at dorsal proximal phalanges for PIP extension  pain-free 12 reps.         OT Education - 11/26/21 1530     Education Details Progress and HEP changes    Person(s) Educated Patient    Methods Explanation;Demonstration;Tactile cues;Verbal cues;Handout    Comprehension Verbal cues required;Returned demonstration;Verbalized understanding                 OT Long Term Goals - 11/20/21 1721       OT LONG TERM GOAL #1   Title Patient to be independent in the home program to decrease edema as well as pain to increase PIP extension to 0 to put hand in glove or pocket.    Baseline Increase edema of 1 cm at second digit compared to the the right, PIP extension -25 degrees with tenderness at PIP pain 2-3/10    Time 3    Period Weeks    Status New    Target Date 12/11/21      OT LONG TERM GOAL #2   Title Patient left second digit flexion improved for patient to  touch palm without increased symptoms of edema and pain to hold cylinder objects without increase symptoms    Baseline Left second MCP flexion 80 degrees, PIP 90 degrees DIP 50 degrees pain and tenderness at PIP, edema increased by 1 cm  not able to use second digit with composite fisting    Time 5    Period Weeks    Status New    Target Date 12/25/21      OT LONG TERM GOAL #3   Title Left grip and prehension strength increased to more than 75% compared to the right without increased symptoms to pull and push door, use utensils and pull up pants without increased symptoms    Baseline Patient 6 weeks post injury increase swelling of 1 cm increased pain and limited motion we will assess grip strength and prehension    Time 6    Period Weeks    Status New    Target Date 01/01/22                   Plan - 11/26/21 1532     Clinical Impression Statement Patient present at OT evaluation with a diagnosis of left second digit proximal phalanges nondisplaced fracture.  Patient reports she was first immobilized and then put in a buddy strap.  Patient presented OT evaluation last week with 1 cm  of increased swelling in second digit as well as hyperextension at the MCP and flexor contracture at PIP.  Patient show increased stiffness for PIP extension as well as flexion at MCP, PIP and DIP stiffness.  Patient report pain about a 2-3/10 with active range of motion as well as tenderness over PIP joint.  Patient returned this date with decreased edema less than 1/2 cm as well as increased MC flexion to 90, PIP 95 and DIP 60 but continues to have decreased PIP extension of -20 active motion but in the session able to get 0.  Patient limited in functional use of left hand in ADLs and IADLs with increased edema, pain and stiffness with limited strength.  Patient can benefit from skilled OT services    OT Occupational Profile and History Problem Focused Assessment - Including review of records relating to  presenting problem    Occupational performance deficits (Please refer to evaluation for details): ADL's;IADL's;Work;Play;Leisure;Social Participation    Body Structure / Function / Physical Skills ADL;Decreased knowledge of precautions;Flexibility;ROM;IADL;Edema;UE functional use;Pain;Dexterity;Strength    Rehab Potential Good    Clinical Decision Making Limited treatment options, no task modification necessary    Comorbidities Affecting Occupational Performance: None    Modification or Assistance to Complete Evaluation  No modification of tasks or assist necessary to complete eval    OT Frequency 2x / week    OT Duration 6 weeks    OT Treatment/Interventions Self-care/ADL training;Fluidtherapy;DME and/or AE instruction;Splinting;Therapeutic activities;Contrast Bath;Therapeutic exercise;Passive range of motion;Paraffin;Manual Therapy;Patient/family education    Consulted and Agree with Plan of Care Patient             Patient will benefit from skilled therapeutic intervention in order to improve the following deficits and impairments:   Body Structure / Function / Physical Skills: ADL, Decreased knowledge of precautions, Flexibility, ROM, IADL, Edema, UE functional use, Pain, Dexterity, Strength       Visit Diagnosis: Stiffness of left hand, not elsewhere classified  Localized edema  Pain in left hand  Muscle weakness (generalized)    Problem List There are no problems to display for this patient.   Oletta Cohn, OTR/L,CLT 11/26/2021, 4:03 PM  [facility] [address], [place], [address] Phone: [phone]   Fax:  [phone]  Name: Pattricia Weiher MRN: [id number] Date of Birth: May 19, 1966

## 2021-12-03 ENCOUNTER — Ambulatory Visit: Payer: Managed Care, Other (non HMO) | Admitting: Occupational Therapy

## 2021-12-03 DIAGNOSIS — M6281 Muscle weakness (generalized): Secondary | ICD-10-CM

## 2021-12-03 DIAGNOSIS — M79642 Pain in left hand: Secondary | ICD-10-CM

## 2021-12-03 DIAGNOSIS — M25642 Stiffness of left hand, not elsewhere classified: Secondary | ICD-10-CM

## 2021-12-03 DIAGNOSIS — R600 Localized edema: Secondary | ICD-10-CM

## 2021-12-10 ENCOUNTER — Ambulatory Visit: Payer: Managed Care, Other (non HMO) | Admitting: Occupational Therapy

## 2021-12-18 ENCOUNTER — Ambulatory Visit: Payer: Managed Care, Other (non HMO) | Attending: Orthopedic Surgery | Admitting: Occupational Therapy

## 2021-12-18 DIAGNOSIS — M25642 Stiffness of left hand, not elsewhere classified: Secondary | ICD-10-CM | POA: Insufficient documentation

## 2021-12-18 DIAGNOSIS — M79642 Pain in left hand: Secondary | ICD-10-CM | POA: Insufficient documentation

## 2021-12-18 DIAGNOSIS — M6281 Muscle weakness (generalized): Secondary | ICD-10-CM | POA: Diagnosis present

## 2021-12-18 DIAGNOSIS — R600 Localized edema: Secondary | ICD-10-CM | POA: Insufficient documentation

## 2021-12-18 NOTE — Therapy (Signed)
[place] [facility]INE [address], [place], [address] Phone: [phone]   Fax:  [phone]  Occupational Therapy Treatment  Patient Details  Name: April Black MRN: [id number] Date of Birth: 06-20-1965 Referring Provider (OT): Lasandra Beech   Encounter Date: 12/18/2021   OT End of Session - 12/18/21 1813     Visit Number 4    Number of Visits 12    Date for OT Re-Evaluation 01/01/22    OT Start Time 1611    OT Stop Time 1648    OT Time Calculation (min) 37 min    Activity Tolerance Patient tolerated treatment well    Behavior During Therapy [facility] for tasks assessed/performed             Past Medical History:  Diagnosis Date   ADHD (attention deficit hyperactivity disorder)    Anxiety    Arthritis    hips   Asperger's disorder    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 time   Constipation    Family history of adverse reaction to anesthesia    mom- PONV   Hemorrhoids    Herpes    Hypothyroidism    Migraines    weather related, approx 1x/mo   Motion sickness    boats, car-back seat   Multinodular goiter    Osteoporosis    Raynaud disease     Past Surgical History:  Procedure Laterality Date   ARTHOSCOPIC ROTAOR CUFF REPAIR     CHOLECYSTECTOMY     COLONOSCOPY WITH PROPOFOL N/A 06/30/2015   Procedure: COLONOSCOPY WITH PROPOFOL;  Surgeon: Wallace Cullens, MD;  Location: [facility] ENDOSCOPY;  Service: Gastroenterology;  Laterality: N/A;   ESOPHAGOGASTRODUODENOSCOPY (EGD) WITH PROPOFOL N/A 06/30/2015   Procedure: ESOPHAGOGASTRODUODENOSCOPY (EGD) WITH PROPOFOL;  Surgeon: Wallace Cullens, MD;  Location: [facility] ENDOSCOPY;  Service: Gastroenterology;  Laterality: N/A;   LAPAROSCOPIC BILATERAL SALPINGO OOPHERECTOMY Bilateral 08/17/2018   Procedure: LAPAROSCOPIC BILATERAL SALPINGO OOPHORECTOMY;  Surgeon: Schermerhorn, Ihor Austin, MD;  Location: [facility] ORS;  Service: Gynecology;  Laterality:  Bilateral;   SHOULDER ARTHROSCOPY Left 01/17/2016   Procedure: LEFT ARTHROSCOPY SHOULDER WITH DEBRIDEMENT DECOMPRESSION REPAIR OF SLAP TEAR AND  BICEPS TENODESIS;  Surgeon: Christena Flake, MD;  Location: [facility];  Service: Orthopedics;  Laterality: Left;   SHOULDER SURGERY Bilateral    TONSILLECTOMY      There were no vitals filed for this visit.   Subjective Assessment - 12/18/21 1811     Subjective  Doing better is mostly stiff in the morning but I can see the cast at night really helps to straighten my finger.  After the warm water in the morning it loosens up.  I still use most of the time with a silicone compression sleeve with buddy strap during the day at work.  Mostly soreness like a 1/10 in the second digit    Pertinent History April Black is a 56 y.o. femaler sustaining a minimally displaced left index finger proximal phalanx fracture on 10/07/21.     Patient reports improvement in her pain, though states pain can still be annoying. Has been wearing her buddy taping. No numbness, tingling.   office visit at ortho and refer to OT    Patient Stated Goals I want to prevent future problems and want to be able to bend and straightening my left index finger to use it normally    Currently in Pain? Yes  Pain Score 1     Pain Location Finger (Comment which one)    Pain Orientation Left    Pain Descriptors / Indicators Tightness;Sore    Pain Type Acute pain    Pain Onset More than a month ago    Pain Frequency Intermittent                [facility] OT Assessment - 12/18/21 0001       Strength   Right Hand Grip (lbs) 28   pain volar 2nd PIP   Right Hand Lateral Pinch 7 lbs    Right Hand 3 Point Pinch 7 lbs    Left Hand Grip (lbs) 49    Left Hand Lateral Pinch 11 lbs    Left Hand 3 Point Pinch 10 lbs             Patient is to hold off on compression sleeve during the day as well as buddy strap.  Can wear over lunch and dinnertime may be compression sleeve for 2  hours. Continue with PIP cast at nighttime. Coming in patient had full PIP extension.  After a few reps of active assisted range of motion and passive range of motion patient was able to gain MC 90, PIP 95 and DIP 60.                  OT Treatments/Exercises (OP) - 12/18/21 0001       LUE Fluidotherapy   Number Minutes Fluidotherapy 8 Minutes    LUE Fluidotherapy Location Hand    Comments Decrease stiffness increase motion increase edema             Did lose some PIP extension after active range of motion and fluidotherapy.  After fluidotherapy done soft tissue massage to lateral bands of second PIP as well as soft tissue massage using [place] #2 for sweeping over volar second digit as well as palm prior to range of motion focusing this date on PIP extension   Did fabricate patient a quick cast in the past for PIP extension for nighttime use.   Patient continues to do about 3 times a day contrast prior to range of motion and home exercises  extension of PIP done on table-add silicone sleeve under volar DIP to provide somewhat of a hyperextension of PIP when doing passive range of motion for PIP After rubber band for extension PIP strengthening patient to block MC at 0 degrees keeping thumb in palmar abduction doing 4x5 reps of PIP extension strengthening using rubber band. As well as flicking PIP DIP extension with smaller Theraputty balls 10 reps    tendon glides with focus on lumbrical fist with MCP flexion PIP extension, blocked intrinsic a fist and into extension with blocking at dorsal proximal phalanges  and composite fist to highlighter and palm this date with again pressure at dorsal proximal phalanges for PIP extension  pain-free 12 reps.          OT Education - 12/18/21 1813     Education Details Progress and HEP changes    Person(s) Educated Patient    Methods Explanation;Demonstration;Tactile cues;Verbal cues;Handout    Comprehension Verbal cues  required;Returned demonstration;Verbalized understanding                 OT Long Term Goals - 11/20/21 1721       OT LONG TERM GOAL #1   Title Patient to be independent in the home program to decrease edema as well as pain  to increase PIP extension to 0 to put hand in glove or pocket.    Baseline Increase edema of 1 cm at second digit compared to the the right, PIP extension -25 degrees with tenderness at PIP pain 2-3/10    Time 3    Period Weeks    Status New    Target Date 12/11/21      OT LONG TERM GOAL #2   Title Patient left second digit flexion improved for patient to touch palm without increased symptoms of edema and pain to hold cylinder objects without increase symptoms    Baseline Left second MCP flexion 80 degrees, PIP 90 degrees DIP 50 degrees pain and tenderness at PIP, edema increased by 1 cm not able to use second digit with composite fisting    Time 5    Period Weeks    Status New    Target Date 12/25/21      OT LONG TERM GOAL #3   Title Left grip and prehension strength increased to more than 75% compared to the right without increased symptoms to pull and push door, use utensils and pull up pants without increased symptoms    Baseline Patient 6 weeks post injury increase swelling of 1 cm increased pain and limited motion we will assess grip strength and prehension    Time 6    Period Weeks    Status New    Target Date 01/01/22                   Plan - 12/18/21 1814     Clinical Impression Statement Patient present at OT evaluation with a diagnosis of left second digit proximal phalanges nondisplaced fracture.  Patient reports she was first immobilized and then put in a buddy strap.  Patient making progress since start of care and flexion, edema and pain in left second digit.  Patient wearing compression sleeve with a buddy strap most of the day as well as PIP extension cast at nighttime.  Patient MC flexion 90 degrees as well as PIP 95 and DIP 60  degrees after few active assisted range of motion exercises coming in.  Patient shows great PIP extension with less hyperextension at the MCP after fabrication and use of cast last to 3 weeks.  Patient to wean out of compression sleeve during the day and buddy strap.  But continue cast at nighttime.  Did add last time and patient to continue with some strengthening for PIP extension to maintain extension.  Grip and prehension was assess patient decrease for about a pound to 2 pounds and prehension for her age and mostly limited by grip because of pain on volar PIP of second digit.  Patient to wean out of of daytime compression and buddy strap over the next week and focusing on PIP extension as well as composite flexion keeping pain and edema under control.  Patient limited in functional use of left hand in ADLs and IADLs with increased edema, pain and stiffness with limited strength.  Patient can benefit from skilled OT services    OT Occupational Profile and History Problem Focused Assessment - Including review of records relating to presenting problem    Occupational performance deficits (Please refer to evaluation for details): ADL's;IADL's;Work;Play;Leisure;Social Participation    Body Structure / Function / Physical Skills ADL;Decreased knowledge of precautions;Flexibility;ROM;IADL;Edema;UE functional use;Pain;Dexterity;Strength    Rehab Potential Good    Clinical Decision Making Limited treatment options, no task modification necessary    Comorbidities Affecting Occupational  Performance: None    Modification or Assistance to Complete Evaluation  No modification of tasks or assist necessary to complete eval    OT Frequency 1x / week    OT Duration 6 weeks    OT Treatment/Interventions Self-care/ADL training;Fluidtherapy;DME and/or AE instruction;Splinting;Therapeutic activities;Contrast Bath;Therapeutic exercise;Passive range of motion;Paraffin;Manual Therapy;Patient/family education    Consulted and  Agree with Plan of Care Patient             Patient will benefit from skilled therapeutic intervention in order to improve the following deficits and impairments:   Body Structure / Function / Physical Skills: ADL, Decreased knowledge of precautions, Flexibility, ROM, IADL, Edema, UE functional use, Pain, Dexterity, Strength       Visit Diagnosis: Stiffness of left hand, not elsewhere classified  Localized edema  Pain in left hand  Muscle weakness (generalized)    Problem List There are no problems to display for this patient.   Oletta Cohn, OTR/L,CLT 12/18/2021, 6:25 PM  [place] [facility] PHYSICAL AND SPORTS MEDICINE [address] [place], [address] Phone: [phone]   Fax:  [phone]  Name: April Black MRN: [id number] Date of Birth: 1965-08-27

## 2021-12-24 ENCOUNTER — Ambulatory Visit: Payer: Managed Care, Other (non HMO) | Admitting: Occupational Therapy

## 2021-12-24 DIAGNOSIS — M6281 Muscle weakness (generalized): Secondary | ICD-10-CM

## 2021-12-24 DIAGNOSIS — M25642 Stiffness of left hand, not elsewhere classified: Secondary | ICD-10-CM

## 2021-12-24 DIAGNOSIS — R600 Localized edema: Secondary | ICD-10-CM

## 2021-12-24 DIAGNOSIS — M79642 Pain in left hand: Secondary | ICD-10-CM

## 2021-12-24 NOTE — Therapy (Signed)
[place] [facility] PH[facility]TS [facility]ICINE [address] [place], [address] Phone: [phone]   Fax:  [phone]  Occupational Therapy Treatment  Patient Details  Name: April Black MRN: [id number] Date of Birth: 1965-11-12 Referring Provider (OT): Lasandra Beech   Encounter Date: 12/24/2021   OT End of Session - 12/24/21 1716     Visit Number 5    Number of Visits 12    Date for OT Re-Evaluation 01/01/22    OT Start Time 1637    OT Stop Time 1710    OT Time Calculation (min) 33 min    Activity Tolerance Patient tolerated treatment well    Behavior During Therapy [facility] for tasks assessed/performed             Past Medical History:  Diagnosis Date   ADHD (attention deficit hyperactivity disorder)    Anxiety    Arthritis    hips   Asperger's disorder    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 time   Constipation    Family history of adverse reaction to anesthesia    mom- PONV   Hemorrhoids    Herpes    Hypothyroidism    Migraines    weather related, approx 1x/mo   Motion sickness    boats, car-back seat   Multinodular goiter    Osteoporosis    Raynaud disease     Past Surgical History:  Procedure Laterality Date   ARTHOSCOPIC ROTAOR CUFF REPAIR     CHOLECYSTECTOMY     COLONOSCOPY WITH PROPOFOL N/A 06/30/2015   Procedure: COLONOSCOPY WITH PROPOFOL;  Surgeon: Wallace Cullens, MD;  Location: [facility] ENDOSCOPY;  Service: Gastroenterology;  Laterality: N/A;   ESOPHAGOGASTRODUODENOSCOPY (EGD) WITH PROPOFOL N/A 06/30/2015   Procedure: ESOPHAGOGASTRODUODENOSCOPY (EGD) WITH PROPOFOL;  Surgeon: Wallace Cullens, MD;  Location: [facility] ENDOSCOPY;  Service: Gastroenterology;  Laterality: N/A;   LAPAROSCOPIC BILATERAL SALPINGO OOPHERECTOMY Bilateral 08/17/2018   Procedure: LAPAROSCOPIC BILATERAL SALPINGO OOPHORECTOMY;  Surgeon: Schermerhorn, Ihor Austin, MD;  Location: [facility] ORS;  Service: Gynecology;  Laterality:  Bilateral;   SHOULDER ARTHROSCOPY Left 01/17/2016   Procedure: LEFT ARTHROSCOPY SHOULDER WITH DEBRIDEMENT DECOMPRESSION REPAIR OF SLAP TEAR AND  BICEPS TENODESIS;  Surgeon: Christena Flake, MD;  Location: [facility];  Service: Orthopedics;  Laterality: Left;   SHOULDER SURGERY Bilateral    TONSILLECTOMY      There were no vitals filed for this visit.   Subjective Assessment - 12/24/21 1639     Subjective  I have maybe a lower more swelling when I was trying to put my cast on at night.  Provided on the ice and a compression sleeve for our educated on.  Going without the buddy strap and typing more of my finger.  Done the putty and the rubber band with no issues.    Pertinent History April Black is a 56 y.o. femaler sustaining a minimally displaced left index finger proximal phalanx fracture on 10/07/21.     Patient reports improvement in her pain, though states pain can still be annoying. Has been wearing her buddy taping. No numbness, tingling.   office visit at ortho and refer to OT    Patient Stated Goals I want to prevent future problems and want to be able to bend and straightening my left index finger to use it normally    Currently in Pain? Yes    Pain Score 2     Pain Location  Finger (Comment which one)    Pain Orientation Left    Pain Descriptors / Indicators Tender;Aching    Pain Type Acute pain    Pain Onset More than a month ago    Pain Frequency Intermittent                [facility] OT Assessment - 12/24/21 0001       Strength   Right Hand Grip (lbs) 49    Right Hand Lateral Pinch 11 lbs    Right Hand 3 Point Pinch 10 lbs    Left Hand Grip (lbs) 35    Left Hand Lateral Pinch 9 lbs    Left Hand 3 Point Pinch 8 lbs      Left Hand AROM   L Index  MCP 0-90 90 Degrees    L Index PIP 0-100 95 Degrees   0 coming in - in session 95 after 5 reps               Patient done well with wearing compression sleeve only if needed.  Not wearing buddy strap during the  day.  Able to drive and typing on the computer with no issues.  Did notice in the clinic patient still favoring not using second digit with grabbing her pocketbook or picking up objects.    Continue with PIP cast at nighttime-fabricated a new one that fit a little tighter as well as including the DIP in slight hyperextension. Coming in patient had full PIP extension.  After a few reps of active assisted range of motion and passive range of motion patient was able to gain MC 90, PIP 95 and DIP 60.                                      OT Treatments/Exercises (OP) - 12/24/21 0001       LUE Contrast Bath   Time 8 minutes    Comments Prior to soft tissue and active range of motion as well as making new extension cast                      Patient continues to do about 3 times a day contrast prior to range of motion and home exercises  extension of PIP done on table-patient can do rolling over putty with slight stretch for hyperextension of digits including DIP.    Review and patient to continue with rubber band for extension PIP strengthening patient to block MC at 0 degrees keeping thumb in palmar abduction doing 4x5 reps of PIP extension strengthening using rubber band. As well as flicking PIP DIP extension with smaller Theraputty balls 10 reps    tendon glides with focus on lumbrical fist with MCP flexion PIP extension, blocked intrinsic a fist and into extension with blocking at dorsal proximal phalanges  and composite fist to highlighter and palm this date with again pressure at dorsal proximal phalanges for PIP extension  pain-free 12 reps.  Patien to attempt to wean out of of PIP extension cast in about 10 days.  Assess if can maintain PIP extension during the day or use may be cast every other day prior to following up with me the week of 7 August.           OT Education - 12/24/21 1716     Education Details Progress and HEP changes    Person(s)  Educated Patient    Methods Explanation;Demonstration;Tactile cues;Verbal cues;Handout    Comprehension Verbal cues required;Returned demonstration;Verbalized understanding                 OT Long Term Goals - 11/20/21 1721       OT LONG TERM GOAL #1   Title Patient to be independent in the home program to decrease edema as well as pain to increase PIP extension to 0 to put hand in glove or pocket.    Baseline Increase edema of 1 cm at second digit compared to the the right, PIP extension -25 degrees with tenderness at PIP pain 2-3/10    Time 3    Period Weeks    Status New    Target Date 12/11/21      OT LONG TERM GOAL #2   Title Patient left second digit flexion improved for patient to touch palm without increased symptoms of edema and pain to hold cylinder objects without increase symptoms    Baseline Left second MCP flexion 80 degrees, PIP 90 degrees DIP 50 degrees pain and tenderness at PIP, edema increased by 1 cm not able to use second digit with composite fisting    Time 5    Period Weeks    Status New    Target Date 12/25/21      OT LONG TERM GOAL #3   Title Left grip and prehension strength increased to more than 75% compared to the right without increased symptoms to pull and push door, use utensils and pull up pants without increased symptoms    Baseline Patient 6 weeks post injury increase swelling of 1 cm increased pain and limited motion we will assess grip strength and prehension    Time 6    Period Weeks    Status New    Target Date 01/01/22                   Plan - 12/24/21 1717     Clinical Impression Statement Patient present at OT evaluation with a diagnosis of left second digit proximal phalanges nondisplaced fracture.  Patient reports she was first immobilized and then put in a buddy strap.  Patient making progress since start of care and flexion, edema and pain in left second digit.  Patient wearing compression sleeve with a buddy strap  most of the day as well as PIP extension cast at nighttime.  Patient MC flexion 90 degrees as well as PIP 95 and DIP 60 degrees after few active assisted range of motion exercises coming in.  Patient shows great PIP extension with less hyperextension at the MCP after fabrication and use of cast last to 3 weeks.  Patient done great since seen last week with weaning out of buddy strap as well as compression during the day.  Patient in need of new PIP extension cast that is a little bit tighter and putting DIP in slight hyperextension.  Patient continue with weaning out of compression and buddy strap during the day.  Continue PIP extension with composite flexion active range of motion.  Patient will follow-up with me in 1 to 2-1/2 weeks.  Did recommend for patient to start trying to wean out of nighttime PIP extension cast in about 10 days.  Assess if can maintain PIP extension.  Patient did show increased grip and prehension strength since last week without any putty or resistance.  Patient limited in functional use of left hand in ADLs and IADLs with increased edema, pain  and stiffness with limited strength.  Patient can benefit from skilled OT services    OT Occupational Profile and History Problem Focused Assessment - Including review of records relating to presenting problem    Occupational performance deficits (Please refer to evaluation for details): ADL's;IADL's;Work;Play;Leisure;Social Participation    Body Structure / Function / Physical Skills ADL;Decreased knowledge of precautions;Flexibility;ROM;IADL;Edema;UE functional use;Pain;Dexterity;Strength    Rehab Potential Good    Clinical Decision Making Limited treatment options, no task modification necessary    Comorbidities Affecting Occupational Performance: None    Modification or Assistance to Complete Evaluation  No modification of tasks or assist necessary to complete eval    OT Frequency Biweekly    OT Duration 6 weeks    OT  Treatment/Interventions Self-care/ADL training;Fluidtherapy;DME and/or AE instruction;Splinting;Therapeutic activities;Contrast Bath;Therapeutic exercise;Passive range of motion;Paraffin;Manual Therapy;Patient/family education    Consulted and Agree with Plan of Care Patient             Patient will benefit from skilled therapeutic intervention in order to improve the following deficits and impairments:   Body Structure / Function / Physical Skills: ADL, Decreased knowledge of precautions, Flexibility, ROM, IADL, Edema, UE functional use, Pain, Dexterity, Strength       Visit Diagnosis: Stiffness of left hand, not elsewhere classified  Localized edema  Pain in left hand  Muscle weakness (generalized)    Problem List There are no problems to display for this patient.   Oletta Cohn, OTR/L,CLT 12/24/2021, 5:20 PM  [place] [facility] PHYSICAL AND SPORTS MEDICINE [address] [place], [address] Phone: [phone]   Fax:  [phone]  Name: April Black MRN: [id number] Date of Birth: 1965-08-19

## 2022-01-14 ENCOUNTER — Ambulatory Visit: Payer: Managed Care, Other (non HMO) | Attending: Orthopedic Surgery | Admitting: Occupational Therapy

## 2022-01-14 DIAGNOSIS — M6281 Muscle weakness (generalized): Secondary | ICD-10-CM | POA: Diagnosis present

## 2022-01-14 DIAGNOSIS — R600 Localized edema: Secondary | ICD-10-CM | POA: Insufficient documentation

## 2022-01-14 DIAGNOSIS — M25642 Stiffness of left hand, not elsewhere classified: Secondary | ICD-10-CM | POA: Diagnosis present

## 2022-01-14 DIAGNOSIS — M79642 Pain in left hand: Secondary | ICD-10-CM | POA: Diagnosis present

## 2022-01-14 NOTE — Therapy (Signed)
[place] [facility] [address] [place], [place], [address] Phone: [phone]   Fax:  [phone]  Occupational Therapy Treatment  Patient Details  Name: April Black MRN: [id number] Date of Birth: [birth year] Referring Provider (OT): Lasandra Beech   Encounter Date: 01/14/2022   OT End of Session - 01/14/22 1730     Visit Number 6    Number of Visits 12    Date for OT Re-Evaluation 02/25/22    OT Start Time 1650    OT Stop Time 1717    OT Time Calculation (min) 27 min    Activity Tolerance Patient tolerated treatment well    Behavior During Therapy [facility] for tasks assessed/performed             Past Medical History:  Diagnosis Date   ADHD (attention deficit hyperactivity disorder)    Anxiety    Arthritis    hips   Asperger's disorder    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 time   Constipation    Family history of adverse reaction to anesthesia    mom- PONV   Hemorrhoids    Herpes    Hypothyroidism    Migraines    weather related, approx 1x/mo   Motion sickness    boats, car-back seat   Multinodular goiter    Osteoporosis    Raynaud disease     Past Surgical History:  Procedure Laterality Date   ARTHOSCOPIC ROTAOR CUFF REPAIR     CHOLECYSTECTOMY     COLONOSCOPY WITH PROPOFOL N/A 06/30/2015   Procedure: COLONOSCOPY WITH PROPOFOL;  Surgeon: Wallace Cullens, MD;  Location: [facility] ENDOSCOPY;  Service: Gastroenterology;  Laterality: N/A;   ESOPHAGOGASTRODUODENOSCOPY (EGD) WITH PROPOFOL N/A 06/30/2015   Procedure: ESOPHAGOGASTRODUODENOSCOPY (EGD) WITH PROPOFOL;  Surgeon: Wallace Cullens, MD;  Location: [facility] ENDOSCOPY;  Service: Gastroenterology;  Laterality: N/A;   LAPAROSCOPIC BILATERAL SALPINGO OOPHERECTOMY Bilateral 08/17/2018   Procedure: LAPAROSCOPIC BILATERAL SALPINGO OOPHORECTOMY;  Surgeon: Schermerhorn, Ihor Austin, MD;  Location: [facility] ORS;  Service: Gynecology;  Laterality:  Bilateral;   SHOULDER ARTHROSCOPY Left 01/17/2016   Procedure: LEFT ARTHROSCOPY SHOULDER WITH DEBRIDEMENT DECOMPRESSION REPAIR OF SLAP TEAR AND  BICEPS TENODESIS;  Surgeon: Christena Flake, MD;  Location: [facility];  Service: Orthopedics;  Laterality: Left;   SHOULDER SURGERY Bilateral    TONSILLECTOMY      There were no vitals filed for this visit.   Subjective Assessment - 01/14/22 1729     Subjective  My finger is better - swelling is better and I did not wear the cast the last 3 nights. My finger just do not want to stay straight all the way.    Pertinent History April Black is a 56 y.o. femaler sustaining a minimally displaced left index finger proximal phalanx fracture on 10/07/21.     Patient reports improvement in her pain, though states pain can still be annoying. Has been wearing her buddy taping. No numbness, tingling.   office visit at ortho and refer to OT    Patient Stated Goals I want to prevent future problems and want to be able to bend and straightening my left index finger to use it normally    Currently in Pain? No/denies                 Patient arrive with PIP is -10 degrees of extension.  Patient compensated with MCP hyperextension. Patient report decreased pain  as well as edema the last few days as well as not wearing extension cast last 3 nights.  Reviewed with patient again passive range of motion for PIP endrange extension without hyperextending MC. Followed by if needed some soft tissue massage or use Grasston #2 tool for brushing and sweeping over volar PIP and second digit prior to endrange extension. Reviewed with patient again using rubber band for PIP extension but focusing on using gravity as well as placed on hold with blocking MC at 0 degrees. Patient to do 3 sets of 5-6 reps after passive range of motion..     Patient do not need to do heat or a lot of contrast anymore.  Do not have to wear extension cast at night anymore  Patient to focus  on intrinsic a fist followed by composite fist during the day 10 reps 2 sessions.   Patient to continue with home program for 4 to 6 weeks and follow-up                        OT Education - 01/14/22 1729     Education Details Progress and HEP changes    Person(s) Educated Patient    Methods Explanation;Demonstration;Tactile cues;Verbal cues;Handout    Comprehension Verbal cues required;Returned demonstration;Verbalized understanding                 OT Long Term Goals - 01/14/22 1857       OT LONG TERM GOAL #1   Title Patient to be independent in the home program to decrease edema as well as pain to increase PIP extension to 0 to put hand in glove or pocket.    Baseline Increase edema of 1 cm at second digit compared to the the right, PIP extension -25 degrees with tenderness at PIP pain 2-3/10 - NOW edema and pain improved-PIP extension at -10 can get passive range of motion to 0 but unable to maintain    Time 4    Period Weeks    Status On-going    Target Date 02/11/22      OT LONG TERM GOAL #2   Title Patient left second digit flexion improved for patient to touch palm without increased symptoms of edema and pain to hold cylinder objects without increase symptoms    Baseline Left second MCP flexion 80 degrees, PIP 90 degrees DIP 50 degrees pain and tenderness at PIP, edema increased by 1 cm not able to use second digit with composite fisting NOW patient can make composite fist with MC at 90, PIP at 100 but limited in intrinsic a fist-DIP/PIP flexion endrange    Time 8    Period Weeks    Status On-going    Target Date 03/11/22      OT LONG TERM GOAL #3   Title Left grip and prehension strength increased to more than 75% compared to the right without increased symptoms to pull and push door, use utensils and pull up pants without increased symptoms    Baseline 49 R, L 35 grip ; Lat grip 11 R, L 9 lbs - 3 point 10R, L 8 lbs - no pain    Status Achieved                    Plan - 01/14/22 1848     Clinical Impression Statement Patient present at OT evaluation with a diagnosis of left second digit proximal phalanges nondisplaced fracture.  Patient is 4 months  out from injury.  Patient report progress in edema and pain.  Was able to wean out of extension cast the last 3 nights.  Patient continues to be limited into range intrinsic a fist and endrange PIP extension at- 10 degrees.  Patient showed decrease strength and PIP extension unable to maintain endrange.  Modified patient's home exercise for placed on hold PIP extension without hyperextension of MC.  To focus on quality more than quantity.  Focus on endrange PIP extension prior to strengthening and composite flexion.  Patient to continue with home program for 4 to 6 weeks and follow-up patient limited in functional use of left hand in ADLs and IADLs wit h continues stiffness with limited strength.  Patient can benefit from skilled OT services    OT Occupational Profile and History Problem Focused Assessment - Including review of records relating to presenting problem    Occupational performance deficits (Please refer to evaluation for details): ADL's;IADL's;Work;Play;Leisure;Social Participation    Body Structure / Function / Physical Skills ADL;Decreased knowledge of precautions;Flexibility;ROM;IADL;Edema;UE functional use;Pain;Dexterity;Strength    Rehab Potential Good    Clinical Decision Making Limited treatment options, no task modification necessary    Comorbidities Affecting Occupational Performance: None    Modification or Assistance to Complete Evaluation  No modification of tasks or assist necessary to complete eval    OT Frequency Monthly    OT Duration 8 weeks    OT Treatment/Interventions Self-care/ADL training;Fluidtherapy;DME and/or AE instruction;Splinting;Therapeutic activities;Contrast Bath;Therapeutic exercise;Passive range of motion;Paraffin;Manual Therapy;Patient/family  education    Consulted and Agree with Plan of Care Patient             Patient will benefit from skilled therapeutic intervention in order to improve the following deficits and impairments:   Body Structure / Function / Physical Skills: ADL, Decreased knowledge of precautions, Flexibility, ROM, IADL, Edema, UE functional use, Pain, Dexterity, Strength       Visit Diagnosis: Stiffness of left hand, not elsewhere classified  Localized edema  Pain in left hand  Muscle weakness (generalized)    Problem List There are no problems to display for this patient.   April Black, OTR/L,CLT 01/14/2022, 7:01 PM  [place] [facility] PH[facility] AND SPORTS MEDICINE [address] [place], [address] Phone: [phone]   Fax:  [phone]  Name: April Black MRN: [id number] Date of Birth: March 04, 1966

## 2022-02-25 ENCOUNTER — Ambulatory Visit: Payer: Managed Care, Other (non HMO) | Attending: Orthopedic Surgery | Admitting: Occupational Therapy

## 2022-02-26 ENCOUNTER — Ambulatory Visit: Payer: Managed Care, Other (non HMO) | Attending: Orthopedic Surgery | Admitting: Occupational Therapy

## 2022-02-26 DIAGNOSIS — M6281 Muscle weakness (generalized): Secondary | ICD-10-CM | POA: Insufficient documentation

## 2022-02-26 DIAGNOSIS — R600 Localized edema: Secondary | ICD-10-CM | POA: Insufficient documentation

## 2022-02-26 DIAGNOSIS — M79642 Pain in left hand: Secondary | ICD-10-CM | POA: Insufficient documentation

## 2022-02-26 DIAGNOSIS — M25642 Stiffness of left hand, not elsewhere classified: Secondary | ICD-10-CM | POA: Diagnosis present

## 2022-02-26 NOTE — Therapy (Signed)
[place] [facility] [address], [place], [address] Phone: [phone]   Fax:  [phone]  Occupational Therapy Treatment  Patient Details  Name: April Black MRN: [id number] Date of Birth: [birth year] Referring Provider (OT): Tamala Julian   Encounter Date: 02/26/2022   OT End of Session - 02/26/22 1711     Visit Number 7    Number of Visits 8    Date for OT Re-Evaluation 04/23/22    OT Start Time 2778    OT Stop Time 1655    OT Time Calculation (min) 38 min    Activity Tolerance Patient tolerated treatment well    Behavior During Therapy [facility] for tasks assessed/performed             Past Medical History:  Diagnosis Date   ADHD (attention deficit hyperactivity disorder)    Anxiety    Arthritis    hips   Asperger's disorder    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 time   Constipation    Family history of adverse reaction to anesthesia    mom- PONV   Hemorrhoids    Herpes    Hypothyroidism    Migraines    weather related, approx 1x/mo   Motion sickness    boats, car-back seat   Multinodular goiter    Osteoporosis    Raynaud disease     Past Surgical History:  Procedure Laterality Date   ARTHOSCOPIC ROTAOR CUFF REPAIR     CHOLECYSTECTOMY     COLONOSCOPY WITH PROPOFOL N/A 06/30/2015   Procedure: COLONOSCOPY WITH PROPOFOL;  Surgeon: Hulen Luster, MD;  Location: [facility] ENDOSCOPY;  Service: Gastroenterology;  Laterality: N/A;   ESOPHAGOGASTRODUODENOSCOPY (EGD) WITH PROPOFOL N/A 06/30/2015   Procedure: ESOPHAGOGASTRODUODENOSCOPY (EGD) WITH PROPOFOL;  Surgeon: Hulen Luster, MD;  Location: [facility] ENDOSCOPY;  Service: Gastroenterology;  Laterality: N/A;   LAPAROSCOPIC BILATERAL SALPINGO OOPHERECTOMY Bilateral 08/17/2018   Procedure: LAPAROSCOPIC BILATERAL SALPINGO OOPHORECTOMY;  Surgeon: Schermerhorn, Gwen Her, MD;  Location: [facility] ORS;  Service: Gynecology;  Laterality:  Bilateral;   SHOULDER ARTHROSCOPY Left 01/17/2016   Procedure: LEFT ARTHROSCOPY SHOULDER WITH DEBRIDEMENT DECOMPRESSION REPAIR OF SLAP TEAR AND  BICEPS TENODESIS;  Surgeon: Corky Mull, MD;  Location: [facility];  Service: Orthopedics;  Laterality: Left;   SHOULDER SURGERY Bilateral    TONSILLECTOMY      There were no vitals filed for this visit.   Subjective Assessment - 02/26/22 1709     Subjective  I am doing much better and using it more not favoring anymore.  The swelling is better.  I will see him about 90% better.    Pertinent History April Black is a 56 y.o. femaler sustaining a minimally displaced left index finger proximal phalanx fracture on 10/07/21.     Patient reports improvement in her pain, though states pain can still be annoying. Has been wearing her buddy taping. No numbness, tingling.   office visit at ortho and refer to OT    Patient Stated Goals I want to prevent future problems and want to be able to bend and straightening my left index finger to use it normally    Currently in Pain? No/denies                [facility] OT Assessment - 02/26/22 0001       Strength   Right Hand Grip (lbs) 49    Right Hand Lateral  Pinch 11 lbs    Right Hand 3 Point Pinch 10 lbs    Left Hand Grip (lbs) 41    Left Hand Lateral Pinch 10 lbs    Left Hand 3 Point Pinch 9 lbs      Left Hand AROM   L Index  MCP 0-90 90 Degrees    L Index PIP 0-100 95 Degrees   -5   L Index DIP 0-70 50 Degrees               Great improvement in grip and prehension strength.  Close to within normal limits.  Endrange extension limited by 5 degrees.  As well as composite flexion. Remind patient it can take 6 months to year to get end range of motion. Edema and pain improved greatly patient feels she is 90% back to normal.  Using hand mostly normally in all activities not favoring anymore.         OT Treatments/Exercises (OP) - 02/26/22 0001       LUE Paraffin   Number Minutes  Paraffin 8 Minutes    LUE Paraffin Location Hand    Comments LMB splint on for PIP extention               Reviewed with patient again home exercises for endrange PIP extension and strengthening PIP extension with a rubber band placed and holding and blocking MC at 0 degrees of extension.  To prevent hyperextension of MC. Also passive range of motion to DIP flexion with PIP flexion working on composite. After paraffin prior to range of motion did some soft tissue massage to volar flexors of the palm and second digit to using mini massager and Grasston tool for brushing.  Patient tolerating well.  Patient can continue with home program and if needed can follow-up with me in 8 weeks otherwise we will discharge.       OT Education - 02/26/22 1711     Education Details Progress and HEP changes    Person(s) Educated Patient    Methods Explanation;Demonstration;Tactile cues;Verbal cues;Handout    Comprehension Verbal cues required;Returned demonstration;Verbalized understanding                 OT Long Term Goals - 02/26/22 1714       OT LONG TERM GOAL #1   Title Patient to be independent in the home program to decrease edema as well as pain to increase PIP extension to 0 to put hand in glove or pocket.    Baseline Edema and pain improved greatly PIP extension -5.  Patient to work on the computer most of the time and show some hyperextension of the MCP with -5 degrees of PIP extension.  No trouble with putting hand in pocket or glove.    Time 8    Period Weeks    Status On-going    Target Date 04/23/22      OT LONG TERM GOAL #2   Title Patient left second digit flexion improved for patient to touch palm without increased symptoms of edema and pain to hold cylinder objects without increase symptoms    Status Achieved      OT LONG TERM GOAL #3   Title Left grip and prehension strength increased to more than 75% compared to the right without increased symptoms to pull and  push door, use utensils and pull up pants without increased symptoms    Status Achieved  Plan - 02/26/22 1712     Clinical Impression Statement Patient present at OT evaluation with a diagnosis of left second digit proximal phalanges nondisplaced fracture.  Patient is 5 months out from injury.  Patient returned this date for 67-month checkup.  Patient with great improvement in edema, pain in grip and prehension strength.  Patient reports she is using hand mostly normally.  Feels like she is about 90% back to normal.  Patient still limited in last 5 degrees of PIP extension as well as composite flexion of DIP/PIP flexion.  Patient to continue with endrange strengthening of PIP extension and flexion motion.  Patient can follow-up with me in 8 weeks if needed.    OT Occupational Profile and History Problem Focused Assessment - Including review of records relating to presenting problem    Occupational performance deficits (Please refer to evaluation for details): ADL's;IADL's;Work;Play;Leisure;Social Participation    Body Structure / Function / Physical Skills ADL;Decreased knowledge of precautions;Flexibility;ROM;IADL;Edema;UE functional use;Pain;Dexterity;Strength    Rehab Potential Good    Comorbidities Affecting Occupational Performance: None    Modification or Assistance to Complete Evaluation  No modification of tasks or assist necessary to complete eval    OT Frequency --   8 wks   OT Duration 8 weeks    OT Treatment/Interventions Self-care/ADL training;Fluidtherapy;DME and/or AE instruction;Splinting;Therapeutic activities;Contrast Bath;Therapeutic exercise;Passive range of motion;Paraffin;Manual Therapy;Patient/family education    Consulted and Agree with Plan of Care Patient             Patient will benefit from skilled therapeutic intervention in order to improve the following deficits and impairments:   Body Structure / Function / Physical Skills: ADL,  Decreased knowledge of precautions, Flexibility, ROM, IADL, Edema, UE functional use, Pain, Dexterity, Strength       Visit Diagnosis: Stiffness of left hand, not elsewhere classified  Localized edema  Pain in left hand  Muscle weakness (generalized)    Problem List There are no problems to display for this patient.   Rosalyn Gess, OTR/L,CLT 02/26/2022, 5:15 PM  [place] PHYSICAL AND SPORTS MEDICINE [address] [place], [address] Phone: [phone]   Fax:  [phone]  Name: April Black MRN: [id number] Date of Birth: 16-Feb-1966

## 2022-12-18 ENCOUNTER — Other Ambulatory Visit: Payer: Self-pay | Admitting: Obstetrics and Gynecology

## 2022-12-18 DIAGNOSIS — Z1231 Encounter for screening mammogram for malignant neoplasm of breast: Secondary | ICD-10-CM

## 2023-01-02 ENCOUNTER — Ambulatory Visit
Admission: RE | Admit: 2023-01-02 | Discharge: 2023-01-02 | Disposition: A | Discharge status: Home / Self Care | Payer: Managed Care, Other (non HMO) | Source: Ambulatory Visit | Attending: Obstetrics and Gynecology | Admitting: Obstetrics and Gynecology

## 2023-01-02 DIAGNOSIS — Z1231 Encounter for screening mammogram for malignant neoplasm of breast: Secondary | ICD-10-CM | POA: Insufficient documentation

## 2023-06-27 ENCOUNTER — Other Ambulatory Visit: Payer: Self-pay | Admitting: Internal Medicine

## 2023-06-27 DIAGNOSIS — Z136 Encounter for screening for cardiovascular disorders: Secondary | ICD-10-CM

## 2023-07-08 ENCOUNTER — Ambulatory Visit
Admission: RE | Admit: 2023-07-08 | Discharge: 2023-07-08 | Disposition: A | Discharge status: Home / Self Care | Payer: Self-pay | Source: Ambulatory Visit | Attending: Internal Medicine | Admitting: Internal Medicine

## 2023-07-08 DIAGNOSIS — Z136 Encounter for screening for cardiovascular disorders: Secondary | ICD-10-CM | POA: Insufficient documentation

## 2023-12-16 ENCOUNTER — Other Ambulatory Visit: Payer: Self-pay | Admitting: Obstetrics and Gynecology

## 2023-12-16 DIAGNOSIS — Z1231 Encounter for screening mammogram for malignant neoplasm of breast: Secondary | ICD-10-CM

## 2024-01-05 ENCOUNTER — Ambulatory Visit
Admission: RE | Admit: 2024-01-05 | Discharge: 2024-01-05 | Disposition: A | Discharge status: Home / Self Care | Source: Ambulatory Visit | Attending: Obstetrics and Gynecology | Admitting: Obstetrics and Gynecology

## 2024-01-05 DIAGNOSIS — Z1231 Encounter for screening mammogram for malignant neoplasm of breast: Secondary | ICD-10-CM | POA: Insufficient documentation
